# Patient Record
Sex: Female | Born: 1985 | Race: Black or African American | Hispanic: No | Marital: Single | Smoking: Former smoker
Health system: Southern US, Community
[De-identification: ages and names within clinical notes are randomized; demographics above are authoritative.]

## PROBLEM LIST (undated history)

## (undated) DIAGNOSIS — D219 Benign neoplasm of connective and other soft tissue, unspecified: Secondary | ICD-10-CM

## (undated) HISTORY — PX: KNEE SURGERY: SHX244

---

## 2008-05-29 ENCOUNTER — Inpatient Hospital Stay (HOSPITAL_COMMUNITY): Admission: AD | Admit: 2008-05-29 | Discharge: 2008-05-29 | Payer: Self-pay | Admitting: Obstetrics

## 2008-06-10 ENCOUNTER — Inpatient Hospital Stay (HOSPITAL_COMMUNITY): Admission: AD | Admit: 2008-06-10 | Discharge: 2008-06-10 | Payer: Self-pay | Admitting: Obstetrics

## 2008-06-30 ENCOUNTER — Inpatient Hospital Stay (HOSPITAL_COMMUNITY): Admission: RE | Admit: 2008-06-30 | Discharge: 2008-07-03 | Payer: Self-pay | Admitting: Obstetrics

## 2010-12-17 ENCOUNTER — Emergency Department (HOSPITAL_BASED_OUTPATIENT_CLINIC_OR_DEPARTMENT_OTHER)
Admission: EM | Admit: 2010-12-17 | Discharge: 2010-12-17 | Payer: Self-pay | Source: Home / Self Care | Admitting: Emergency Medicine

## 2011-03-02 ENCOUNTER — Emergency Department (HOSPITAL_BASED_OUTPATIENT_CLINIC_OR_DEPARTMENT_OTHER)
Admission: EM | Admit: 2011-03-02 | Discharge: 2011-03-02 | Disposition: A | Discharge status: Home / Self Care | Payer: Self-pay | Attending: Emergency Medicine | Admitting: Emergency Medicine

## 2011-03-02 DIAGNOSIS — H9209 Otalgia, unspecified ear: Secondary | ICD-10-CM | POA: Insufficient documentation

## 2011-03-02 DIAGNOSIS — H6690 Otitis media, unspecified, unspecified ear: Secondary | ICD-10-CM | POA: Insufficient documentation

## 2011-08-24 LAB — WET PREP, GENITAL
Clue Cells Wet Prep HPF POC: NONE SEEN
Yeast Wet Prep HPF POC: NONE SEEN

## 2011-08-25 LAB — URINALYSIS, ROUTINE W REFLEX MICROSCOPIC
Bilirubin Urine: NEGATIVE
Glucose, UA: NEGATIVE
Hgb urine dipstick: NEGATIVE
Protein, ur: NEGATIVE
Specific Gravity, Urine: 1.01
Urobilinogen, UA: 0.2

## 2011-08-25 LAB — RPR: RPR Ser Ql: NONREACTIVE

## 2011-08-25 LAB — CBC
HCT: 31.7 — ABNORMAL LOW
HCT: 25.8 — ABNORMAL LOW
Hemoglobin: 8.5 — ABNORMAL LOW
MCHC: 34
MCV: 87.4
MCV: 87.1
Platelets: 300
RBC: 3.63 — ABNORMAL LOW
RBC: 2.97 — ABNORMAL LOW
RBC: 3.8 — ABNORMAL LOW
RDW: 14.4
WBC: 10.7 — ABNORMAL HIGH
WBC: 10.4
WBC: 20.9 — ABNORMAL HIGH

## 2011-08-25 LAB — COMPREHENSIVE METABOLIC PANEL
AST: 22
BUN: 2 — ABNORMAL LOW
CO2: 24
Calcium: 8.7
Chloride: 109
Creatinine, Ser: 0.66
GFR calc Af Amer: >60
GFR calc non Af Amer: >60
Total Bilirubin: 0.7

## 2011-08-25 LAB — URIC ACID: Uric Acid, Serum: 3.8

## 2011-08-25 LAB — LACTATE DEHYDROGENASE: LDH: 150

## 2011-09-07 DIAGNOSIS — R5381 Other malaise: Secondary | ICD-10-CM | POA: Insufficient documentation

## 2011-09-08 ENCOUNTER — Emergency Department (HOSPITAL_BASED_OUTPATIENT_CLINIC_OR_DEPARTMENT_OTHER)
Admission: EM | Admit: 2011-09-08 | Discharge: 2011-09-08 | Disposition: A | Discharge status: Home / Self Care | Payer: Self-pay | Attending: Emergency Medicine | Admitting: Emergency Medicine

## 2011-09-08 ENCOUNTER — Encounter: Payer: Self-pay | Admitting: *Deleted

## 2011-09-08 DIAGNOSIS — R5383 Other fatigue: Secondary | ICD-10-CM

## 2011-09-08 HISTORY — DX: Benign neoplasm of connective and other soft tissue, unspecified: D21.9

## 2011-09-08 LAB — BASIC METABOLIC PANEL
BUN: 10 mg/dL (ref 6–23)
CO2: 26 mEq/L (ref 19–32)
Calcium: 9.3 mg/dL (ref 8.4–10.5)
Glucose, Bld: 88 mg/dL (ref 70–99)
Potassium: 4.6 mEq/L (ref 3.5–5.1)
Sodium: 138 mEq/L (ref 135–145)

## 2011-09-08 LAB — URINALYSIS, ROUTINE W REFLEX MICROSCOPIC
Bilirubin Urine: NEGATIVE
Glucose, UA: NEGATIVE mg/dL
Specific Gravity, Urine: 1.022 (ref 1.005–1.030)

## 2011-09-08 LAB — PREGNANCY, URINE: Preg Test, Ur: NEGATIVE

## 2011-09-08 LAB — CBC
HCT: 35.8 % — ABNORMAL LOW (ref 36.0–46.0)
Hemoglobin: 12.4 g/dL (ref 12.0–15.0)
MCH: 28.9 pg (ref 26.0–34.0)
MCHC: 34.6 g/dL (ref 30.0–36.0)
MCV: 83.4 fL (ref 78.0–100.0)
RBC: 4.29 MIL/uL (ref 3.87–5.11)

## 2011-09-08 LAB — DIFFERENTIAL
Basophils Relative: 0 % (ref 0–1)
Eosinophils Absolute: 0.2 10*3/uL (ref 0.0–0.7)
Eosinophils Relative: 3 % (ref 0–5)
Monocytes Absolute: 0.6 10*3/uL (ref 0.1–1.0)
Neutro Abs: 4.5 10*3/uL (ref 1.7–7.7)
Neutrophils Relative %: 55 % (ref 43–77)

## 2011-09-08 LAB — URINE MICROSCOPIC-ADD ON

## 2011-09-08 NOTE — ED Notes (Signed)
States felt weak after getting off work last night- also some dizziness and nausea without vomiting- pt reports abd bloating also but has dx of fibroids

## 2011-09-08 NOTE — ED Provider Notes (Signed)
History     CSN: [id number] Arrival date & time: 09/08/2011 12:09 AM  Chief Complaint  Patient presents with  . Fatigue    (Consider location/radiation/quality/duration/timing/severity/associated sxs/prior treatment) The history is provided by the patient. No language interpreter was used.  Jennifer Riddle is a 25 y.o.female who presents today complaining of fatigue and just not feeling right.  She denies any pain. She does have a history of fibroids but no other significant medical problems. She can think of nothing that makes her symptoms better or worse. She denies any nausea or vomiting. She's not had any urinary symptoms or vaginal discharge. Patient did not think she could be pregnant. She has no known sick contacts. She denies any recent weight changes or fevers. She can think of no other associated or modifying factors. Patient's symptoms have lasted for 3 days.  Past Medical History  Diagnosis Date  . Fibroid tumor     Past Surgical History  Procedure Date  . Knee surgery     History reviewed. No pertinent family history.  History  Substance Use Topics  . Smoking status: Former Games developer  . Smokeless tobacco: Never Used  . Alcohol Use: No    OB History    Grav Para Term Preterm Abortions TAB SAB Ect Mult Living                  Review of Systems  Constitutional: Negative.   HENT: Negative.   Eyes: Negative.   Respiratory: Negative.   Cardiovascular: Negative.   Gastrointestinal: Negative.   Genitourinary: Negative.   Musculoskeletal: Negative.   Skin: Negative.   Neurological: Negative.   Hematological: Negative.   Psychiatric/Behavioral: Negative.   All other systems reviewed and are negative.    Allergies  Sulfa antibiotics  Home Medications  No current outpatient prescriptions on file.  BP 106/67  Pulse 69  Temp(Src) 98 F (36.7 C) (Oral)  Resp 18  Ht 5\' 5"  (1.651 m)  Wt 110 lb (49.896 kg)  BMI 18.31 kg/m2  SpO2 100%  LMP  08/18/2011  Physical Exam  Nursing note and vitals reviewed. Constitutional: She is oriented to person, place, and time. She appears well-developed and well-nourished. No distress.  HENT:  Head: Normocephalic and atraumatic.  Eyes: Conjunctivae and EOM are normal. Pupils are equal, round, and reactive to light.  Neck: Normal range of motion.  Cardiovascular: Normal rate, regular rhythm, normal heart sounds and intact distal pulses.  Exam reveals no gallop and no friction rub.   No murmur heard. Pulmonary/Chest: Effort normal and breath sounds normal. No respiratory distress. She has no wheezes. She has no rales.  Abdominal: Soft. Bowel sounds are normal. She exhibits no distension. There is no tenderness. There is no rebound and no guarding.  Musculoskeletal: Normal range of motion. She exhibits no edema and no tenderness.  Neurological: She is alert and oriented to person, place, and time. No cranial nerve deficit. She exhibits normal muscle tone. Coordination normal.  Skin: Skin is warm and dry. No rash noted.  Psychiatric: She has a normal mood and affect.    ED Course  Procedures (including critical care time)  Labs Reviewed  CBC - Abnormal; Notable for the following:    HCT 35.8 (*)    All other components within normal limits  URINALYSIS, ROUTINE W REFLEX MICROSCOPIC - Abnormal; Notable for the following:    Hgb urine dipstick TRACE (*)    All other components within normal limits  DIFFERENTIAL  BASIC METABOLIC  PANEL  PREGNANCY, URINE  URINE MICROSCOPIC-ADD ON   No results found.   1. Fatigue       MDM  Patient is a well-developed well-nourished female in no acute distress. Patient complained of fatigue. She had a basic metabolic panel to evaluate for any electrolyte abnormalities as well as a urinalysis to evaluate for UTI and a urine pregnancy test to evaluate for possible pregnancy. She had no anemia on CBC. No additional studies were felt necessary. Patient was  told that she should followup with her primary care physician if this persisted. Patient was comfortable with plan and was discharged home in good condition.        Cyndra Numbers, MD 09/08/11 [phone]

## 2011-09-08 NOTE — ED Notes (Signed)
D/c home- No rx given

## 2012-09-27 ENCOUNTER — Emergency Department (HOSPITAL_BASED_OUTPATIENT_CLINIC_OR_DEPARTMENT_OTHER)
Admission: EM | Admit: 2012-09-27 | Discharge: 2012-09-27 | Disposition: A | Discharge status: Home / Self Care | Payer: Self-pay | Attending: Emergency Medicine | Admitting: Emergency Medicine

## 2012-09-27 ENCOUNTER — Encounter (HOSPITAL_BASED_OUTPATIENT_CLINIC_OR_DEPARTMENT_OTHER): Payer: Self-pay | Admitting: Emergency Medicine

## 2012-09-27 DIAGNOSIS — Z87891 Personal history of nicotine dependence: Secondary | ICD-10-CM | POA: Insufficient documentation

## 2012-09-27 DIAGNOSIS — Z3202 Encounter for pregnancy test, result negative: Secondary | ICD-10-CM | POA: Insufficient documentation

## 2012-09-27 DIAGNOSIS — N390 Urinary tract infection, site not specified: Secondary | ICD-10-CM | POA: Insufficient documentation

## 2012-09-27 DIAGNOSIS — Z872 Personal history of diseases of the skin and subcutaneous tissue: Secondary | ICD-10-CM | POA: Insufficient documentation

## 2012-09-27 DIAGNOSIS — R109 Unspecified abdominal pain: Secondary | ICD-10-CM | POA: Insufficient documentation

## 2012-09-27 LAB — URINALYSIS, ROUTINE W REFLEX MICROSCOPIC
Bilirubin Urine: NEGATIVE
Ketones, ur: NEGATIVE mg/dL
Protein, ur: 30 mg/dL — AB
Urobilinogen, UA: 1 mg/dL (ref 0.0–1.0)

## 2012-09-27 LAB — URINE MICROSCOPIC-ADD ON

## 2012-09-27 MED ORDER — CEPHALEXIN 500 MG PO CAPS: 500.0000 mg | ORAL_CAPSULE | Freq: Four times a day (QID) | ORAL | Status: DC

## 2012-09-27 NOTE — ED Provider Notes (Signed)
History     CSN: [id number]  Arrival date & time 09/27/12  1151   First MD Initiated Contact with Patient 09/27/12 1200      Chief Complaint  Patient presents with  . Dysuria    (Consider location/radiation/quality/duration/timing/severity/associated sxs/prior treatment) Patient is a 26 y.o. female presenting with dysuria. The history is provided by the patient.  Dysuria  Associated symptoms include urgency. Pertinent negatives include no nausea and no vomiting.   patient with a history of urinary frequency for 3 days. Some mild discomfort in the suprapubic area. Urgency with urination. No fever no significant abdominal pain or back pain no nausea no vomiting. Last menstrual period was October 25.  Past Medical History  Diagnosis Date  . Fibroid tumor     Past Surgical History  Procedure Date  . Knee surgery     No family history on file.  History  Substance Use Topics  . Smoking status: Former Games developer  . Smokeless tobacco: Never Used  . Alcohol Use: No    OB History    Grav Para Term Preterm Abortions TAB SAB Ect Mult Living                  Review of Systems  Constitutional: Negative for fever.  HENT: Negative for congestion.   Respiratory: Negative for shortness of breath.   Cardiovascular: Negative for chest pain.  Gastrointestinal: Positive for abdominal pain. Negative for nausea and vomiting.  Genitourinary: Positive for dysuria and urgency. Negative for vaginal discharge.  Musculoskeletal: Negative for back pain.  Skin: Negative for rash.  Neurological: Negative for headaches.  Hematological: Does not bruise/bleed easily.    Allergies  Sulfa antibiotics  Home Medications   Current Outpatient Rx  Name Route Sig Dispense Refill  . CEPHALEXIN 500 MG PO CAPS Oral Take 1 capsule (500 mg total) by mouth 4 (four) times daily. 28 capsule 0    BP 123/76  Pulse 79  Temp 98.1 F (36.7 C) (Oral)  Resp 16  Ht 5\' 6"  (1.676 m)  Wt 108 lb (48.988 kg)   BMI 17.43 kg/m2  SpO2 100%  LMP 09/20/2012  Physical Exam  Nursing note and vitals reviewed. Constitutional: She is oriented to person, place, and time. She appears well-developed and well-nourished.  HENT:  Head: Normocephalic and atraumatic.  Eyes: Conjunctivae normal are normal. Pupils are equal, round, and reactive to light.  Neck: Normal range of motion.  Cardiovascular: Normal rate and regular rhythm.   Pulmonary/Chest: Effort normal and breath sounds normal.  Abdominal: Soft. Bowel sounds are normal. There is no tenderness.  Musculoskeletal: Normal range of motion.  Neurological: She is alert and oriented to person, place, and time. No cranial nerve deficit. She exhibits normal muscle tone. Coordination normal.  Skin: Skin is warm. No rash noted.    ED Course  Procedures (including critical care time)  Labs Reviewed  URINALYSIS, ROUTINE W REFLEX MICROSCOPIC - Abnormal; Notable for the following:    APPearance CLOUDY (*)     Hgb urine dipstick SMALL (*)     Protein, ur 30 (*)     Leukocytes, UA LARGE (*)     All other components within normal limits  URINE MICROSCOPIC-ADD ON - Abnormal; Notable for the following:    Squamous Epithelial / LPF FEW (*)     Bacteria, UA FEW (*)     All other components within normal limits  PREGNANCY, URINE  URINE CULTURE   No results found.   1. Urinary  tract infection       MDM  Urinalysis consistent with urinary tract infection. Patient is not having signs or symptoms consistent with pyelonephritis. She is not pregnant. Will treat with Keflex urine was sent for culture.        Shelda Jakes, MD 09/27/12 1324

## 2012-09-27 NOTE — ED Notes (Signed)
Pt c/o urinary frequency x 3 days

## 2012-09-29 LAB — URINE CULTURE

## 2012-11-01 ENCOUNTER — Encounter (HOSPITAL_BASED_OUTPATIENT_CLINIC_OR_DEPARTMENT_OTHER): Payer: Self-pay | Admitting: *Deleted

## 2012-11-01 ENCOUNTER — Emergency Department (HOSPITAL_BASED_OUTPATIENT_CLINIC_OR_DEPARTMENT_OTHER)
Admission: EM | Admit: 2012-11-01 | Discharge: 2012-11-01 | Disposition: A | Discharge status: Home / Self Care | Payer: Self-pay | Attending: Emergency Medicine | Admitting: Emergency Medicine

## 2012-11-01 DIAGNOSIS — R6883 Chills (without fever): Secondary | ICD-10-CM | POA: Insufficient documentation

## 2012-11-01 DIAGNOSIS — Z87891 Personal history of nicotine dependence: Secondary | ICD-10-CM | POA: Insufficient documentation

## 2012-11-01 DIAGNOSIS — Z8742 Personal history of other diseases of the female genital tract: Secondary | ICD-10-CM | POA: Insufficient documentation

## 2012-11-01 DIAGNOSIS — R197 Diarrhea, unspecified: Secondary | ICD-10-CM | POA: Insufficient documentation

## 2012-11-01 DIAGNOSIS — R109 Unspecified abdominal pain: Secondary | ICD-10-CM | POA: Insufficient documentation

## 2012-11-01 DIAGNOSIS — Z792 Long term (current) use of antibiotics: Secondary | ICD-10-CM | POA: Insufficient documentation

## 2012-11-01 DIAGNOSIS — R112 Nausea with vomiting, unspecified: Secondary | ICD-10-CM | POA: Insufficient documentation

## 2012-11-01 LAB — URINE MICROSCOPIC-ADD ON

## 2012-11-01 LAB — URINALYSIS, ROUTINE W REFLEX MICROSCOPIC
Bilirubin Urine: NEGATIVE
Glucose, UA: NEGATIVE mg/dL
Hgb urine dipstick: NEGATIVE
Nitrite: NEGATIVE
Specific Gravity, Urine: 1.02 (ref 1.005–1.030)
pH: 6.5 (ref 5.0–8.0)

## 2012-11-01 MED ORDER — ONDANSETRON HCL 4 MG/2ML IJ SOLN
4.0000 mg | Freq: Once | INTRAMUSCULAR | Status: AC
  Administered 2012-11-01: 4 mg via INTRAVENOUS
  Filled 2012-11-01: qty 2

## 2012-11-01 MED ORDER — PROMETHAZINE HCL 25 MG PO TABS: 25.0000 mg | ORAL_TABLET | Freq: Four times a day (QID) | ORAL | Status: DC | PRN

## 2012-11-01 MED ORDER — SODIUM CHLORIDE 0.9 % IV BOLUS (SEPSIS)
1000.0000 mL | Freq: Once | INTRAVENOUS | Status: AC
  Administered 2012-11-01: 1000 mL via INTRAVENOUS

## 2012-11-01 NOTE — ED Notes (Signed)
Patient states she has had nausea, vomiting and diarrhea since 8pm last night.  And is associated with chills.

## 2012-11-01 NOTE — ED Provider Notes (Signed)
History     CSN: [id number]  Arrival date & time 11/01/12  7829   First MD Initiated Contact with Patient 11/01/12 [phone]      Chief Complaint  Patient presents with  . Diarrhea  . Emesis    The history is provided by the patient.   patient reports nausea vomiting and diarrhea since last night.  Chills without documented fever.  Abdominal cramping without focal pain.  No chest pain or shortness of breath.  Symptoms are mild in severity.  Nothing worsens or improves her symptoms.  Recent sick contacts as her daughter had similar symptoms.  No other significant medical problems.  No blood in her stool or vomit   Past Medical History  Diagnosis Date  . Fibroid tumor     Past Surgical History  Procedure Date  . Knee surgery     No family history on file.  History  Substance Use Topics  . Smoking status: Former Games developer  . Smokeless tobacco: Never Used  . Alcohol Use: No    OB History    Grav Para Term Preterm Abortions TAB SAB Ect Mult Living                  Review of Systems  All other systems reviewed and are negative.    Allergies  Sulfa antibiotics  Home Medications   Current Outpatient Rx  Name  Route  Sig  Dispense  Refill  . ACETAMINOPHEN 500 MG PO TABS   Oral   Take 1,000 mg by mouth every 6 (six) hours as needed.         . CEPHALEXIN 500 MG PO CAPS   Oral   Take 1 capsule (500 mg total) by mouth 4 (four) times daily.   28 capsule   0   . PROMETHAZINE HCL 25 MG PO TABS   Oral   Take 1 tablet (25 mg total) by mouth every 6 (six) hours as needed for nausea.   12 tablet   0     BP 128/72  Pulse 88  Temp 98.4 F (36.9 C) (Oral)  Resp 20  Ht 5\' 5"  (1.651 m)  Wt 105 lb (47.628 kg)  BMI 17.47 kg/m2  SpO2 100%  LMP 10/07/2012  Physical Exam  Nursing note and vitals reviewed. Constitutional: She is oriented to person, place, and time. She appears well-developed and well-nourished. No distress.  HENT:  Head: Normocephalic and atraumatic.   Eyes: EOM are normal.  Neck: Normal range of motion.  Cardiovascular: Normal rate, regular rhythm and normal heart sounds.   Pulmonary/Chest: Effort normal and breath sounds normal.  Abdominal: Soft. She exhibits no distension. There is no tenderness.  Musculoskeletal: Normal range of motion.  Neurological: She is alert and oriented to person, place, and time.  Skin: Skin is warm and dry.  Psychiatric: She has a normal mood and affect. Judgment normal.    ED Course  Procedures (including critical care time)  Labs Reviewed  URINALYSIS, ROUTINE W REFLEX MICROSCOPIC - Abnormal; Notable for the following:    Leukocytes, UA SMALL (*)     All other components within normal limits  URINE MICROSCOPIC-ADD ON - Abnormal; Notable for the following:    Squamous Epithelial / LPF FEW (*)     Bacteria, UA FEW (*)     All other components within normal limits  PREGNANCY, URINE   No results found.   1. Nausea vomiting and diarrhea       MDM  Likely viral gastroenteritis. abd benign on exam. Doubt appendicitis. Able to tolerate fluids in the ER. zofran given. Non toxic appearance.  Hydrated in the emergency department.  10:52 AM Feels much better.  Stable for discharge      Lyanne Co, MD 11/01/12 1053

## 2012-11-27 ENCOUNTER — Encounter (HOSPITAL_BASED_OUTPATIENT_CLINIC_OR_DEPARTMENT_OTHER): Payer: Self-pay | Admitting: Emergency Medicine

## 2012-11-27 ENCOUNTER — Emergency Department (HOSPITAL_BASED_OUTPATIENT_CLINIC_OR_DEPARTMENT_OTHER)
Admission: EM | Admit: 2012-11-27 | Discharge: 2012-11-27 | Disposition: A | Discharge status: Home / Self Care | Payer: Self-pay | Attending: Emergency Medicine | Admitting: Emergency Medicine

## 2012-11-27 DIAGNOSIS — R51 Headache: Secondary | ICD-10-CM | POA: Insufficient documentation

## 2012-11-27 DIAGNOSIS — R52 Pain, unspecified: Secondary | ICD-10-CM | POA: Insufficient documentation

## 2012-11-27 DIAGNOSIS — Z87891 Personal history of nicotine dependence: Secondary | ICD-10-CM | POA: Insufficient documentation

## 2012-11-27 DIAGNOSIS — R000 Tachycardia, unspecified: Secondary | ICD-10-CM | POA: Insufficient documentation

## 2012-11-27 DIAGNOSIS — J111 Influenza due to unidentified influenza virus with other respiratory manifestations: Secondary | ICD-10-CM

## 2012-11-27 DIAGNOSIS — R059 Cough, unspecified: Secondary | ICD-10-CM | POA: Insufficient documentation

## 2012-11-27 DIAGNOSIS — R509 Fever, unspecified: Secondary | ICD-10-CM | POA: Insufficient documentation

## 2012-11-27 DIAGNOSIS — R05 Cough: Secondary | ICD-10-CM | POA: Insufficient documentation

## 2012-11-27 DIAGNOSIS — Z3202 Encounter for pregnancy test, result negative: Secondary | ICD-10-CM | POA: Insufficient documentation

## 2012-11-27 LAB — PREGNANCY, URINE: Preg Test, Ur: NEGATIVE

## 2012-11-27 MED ORDER — OSELTAMIVIR PHOSPHATE 75 MG PO CAPS: 75.0000 mg | ORAL_CAPSULE | Freq: Two times a day (BID) | ORAL | Status: DC

## 2012-11-27 MED ORDER — IBUPROFEN 400 MG PO TABS
600.0000 mg | ORAL_TABLET | Freq: Once | ORAL | Status: AC
  Administered 2012-11-27: 600 mg via ORAL
  Filled 2012-11-27: qty 1

## 2012-11-27 MED ORDER — GUAIFENESIN-CODEINE 100-10 MG/5ML PO SYRP: 5.0000 mL | ORAL_SOLUTION | Freq: Three times a day (TID) | ORAL | Status: DC | PRN

## 2012-11-27 NOTE — ED Provider Notes (Signed)
History     CSN: [id number]  Arrival date & time 11/27/12  1752   First MD Initiated Contact with Patient 11/27/12 2035      Chief Complaint  Patient presents with  . Generalized Body Aches  . Cough  . Fever    (Consider location/radiation/quality/duration/timing/severity/associated sxs/prior treatment) HPI Comments: Patient reports she began coughing less than 48 hours ago and last night developed achiness, fever chills and joint aches. Patient took Tylenol last night and fever broke overnight woke up sweaty. She remains achy all over, tried to go to work today but fevers Coming back. She denies any nausea or vomiting. She does have some mild nasal congestion. 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 She did not have her influenza vaccination.  Patient is a 27 y.o. female presenting with cough and fever. The history is provided by the patient.  Cough Associated symptoms include chills, headaches and myalgias.  Fever Primary symptoms of the febrile illness include fever, fatigue, headaches, cough, myalgias and arthralgias. Primary symptoms do not include nausea, vomiting, diarrhea or rash.  The headache is not associated with neck stiffness.    Past Medical History  Diagnosis Date  . Fibroid tumor     Past Surgical History  Procedure Date  . Knee surgery     History reviewed. No pertinent family history.  History  Substance Use Topics  . Smoking status: Former Games developer  . Smokeless tobacco: Never Used  . Alcohol Use: No    OB History    Grav Para Term Preterm Abortions TAB SAB Ect Mult Living                  Review of Systems  Constitutional: Positive for fever, chills and fatigue. Negative for appetite change.  HENT: Positive for neck pain. Negative for neck stiffness.   Respiratory: Positive for cough.   Gastrointestinal: Negative for nausea, vomiting and diarrhea.  Musculoskeletal: Positive for  myalgias and arthralgias.  Skin: Negative for rash.  Neurological: Positive for headaches.    Allergies  Sulfa antibiotics  Home Medications   Current Outpatient Rx  Name  Route  Sig  Dispense  Refill  . ACETAMINOPHEN 500 MG PO TABS   Oral   Take 1,000 mg by mouth every 6 (six) hours as needed.         . GUAIFENESIN-CODEINE 100-10 MG/5ML PO SYRP   Oral   Take 5 mLs by mouth 3 (three) times daily as needed for cough.   120 mL   0   . OSELTAMIVIR PHOSPHATE 75 MG PO CAPS   Oral   Take 1 capsule (75 mg total) by mouth every 12 (twelve) hours.   10 capsule   0     BP 128/94  Pulse 110  Temp 99.9 F (37.7 C) (Oral)  Resp 18  Ht 5\' 5"  (1.651 m)  Wt 108 lb (48.988 kg)  BMI 17.97 kg/m2  SpO2 100%  LMP 11/27/2012  Physical Exam  Nursing note and vitals reviewed. Constitutional: She is oriented to person, place, and time. She appears well-developed and well-nourished.  HENT:  Head: Normocephalic and atraumatic.  Eyes: Pupils are equal, round, and reactive to light.  Neck: Normal range of motion. No tracheal deviation present.  Cardiovascular: Regular rhythm.  Tachycardia present.   Pulmonary/Chest: Effort normal. No stridor. No respiratory distress. She has no wheezes. She has no rales.  Abdominal: Soft. She exhibits no distension.  There is no tenderness.  Lymphadenopathy:    She has no cervical adenopathy.  Neurological: She is alert and oriented to person, place, and time. No cranial nerve deficit.  Skin: Skin is dry. No rash noted.    ED Course  Procedures (including critical care time)   Labs Reviewed  PREGNANCY, URINE   No results found.   1. Influenza-like illness     Room air saturation is 100% and I interpret this to be normal.  MDM   Patient's symptoms are consistent with influenza-like illness. Plan is to give cough medication, decongestant with codeine as well as Tamiflu. Patient is not toxic or septic in appearance. She agreed to  plan.        Gavin Pound. Oletta Lamas, MD 11/27/12 2048

## 2012-11-27 NOTE — Discharge Instructions (Signed)
Influenza, Adult Influenza ("the flu") is a viral infection of the respiratory tract. It occurs more often in winter months because people spend more time in close contact with one another. Influenza can make you feel very sick. Influenza easily spreads from person to person (contagious). CAUSES   Influenza is caused by a virus that infects the respiratory tract. You can catch the virus by breathing in droplets from an infected person's cough or sneeze. You can also catch the virus by touching something that was recently contaminated with the virus and then touching your mouth, nose, or eyes. SYMPTOMS   Symptoms typically last 4 to 10 days and may include:  Fever.   Chills.   Headache, body aches, and muscle aches.   Sore throat.   Chest discomfort and cough.   Poor appetite.   Weakness or feeling tired.   Dizziness.   Nausea or vomiting.  DIAGNOSIS   Diagnosis of influenza is often made based on your history and a physical exam. A nose or throat swab test can be done to confirm the diagnosis. RISKS AND COMPLICATIONS You may be at risk for a more severe case of influenza if you smoke cigarettes, have diabetes, have chronic heart disease (such as heart failure) or lung disease (such as asthma), or if you have a weakened immune system. Elderly people and pregnant women are also at risk for more serious infections. The most common complication of influenza is a lung infection (pneumonia). Sometimes, this complication can require emergency medical care and may be life-threatening. PREVENTION   An annual influenza vaccination (flu shot) is the best way to avoid getting influenza. An annual flu shot is now routinely recommended for all adults in the [place] TREATMENT   In mild cases, influenza goes away on its own. Treatment is directed at relieving symptoms. For more severe cases, your caregiver may prescribe antiviral medicines to shorten the sickness. Antibiotic medicines are not effective,  because the infection is caused by a virus, not by bacteria. HOME CARE INSTRUCTIONS  Only take over-the-counter or prescription medicines for pain, discomfort, or fever as directed by your caregiver.   Use a cool mist humidifier to make breathing easier.   Get plenty of rest until your temperature returns to normal. This usually takes 3 to 4 days.   Drink enough fluids to keep your urine clear or pale yellow.   Cover your mouth and nose when coughing or sneezing, and wash your hands well to avoid spreading the virus.   Stay home from work or school until your fever has been gone for at least 1 full day.  SEEK MEDICAL CARE IF:    You have chest pain or a deep cough that worsens or produces more mucus.   You have nausea, vomiting, or diarrhea.  SEEK IMMEDIATE MEDICAL CARE IF:    You have difficulty breathing, shortness of breath, or your skin or nails turn bluish.   You have severe neck pain or stiffness.   You have a severe headache, facial pain, or earache.   You have a worsening or recurring fever.   You have nausea or vomiting that cannot be controlled.  MAKE SURE YOU:  Understand these instructions.   Will watch your condition.   Will get help right away if you are not doing well or get worse.  Document Released: 11/10/2000 Document Revised: 05/14/2012 Document Reviewed: 02/12/2012 [employer] Patient Information 2013 [employer].    

## 2012-11-27 NOTE — ED Notes (Signed)
Pt having fever, cough x 2 days.  Pt has generalized aches and pains.  No N/V/D.

## 2013-02-15 ENCOUNTER — Emergency Department (HOSPITAL_BASED_OUTPATIENT_CLINIC_OR_DEPARTMENT_OTHER)
Admission: EM | Admit: 2013-02-15 | Discharge: 2013-02-15 | Disposition: A | Discharge status: Home / Self Care | Payer: Self-pay | Attending: Emergency Medicine | Admitting: Emergency Medicine

## 2013-02-15 ENCOUNTER — Encounter (HOSPITAL_BASED_OUTPATIENT_CLINIC_OR_DEPARTMENT_OTHER): Payer: Self-pay | Admitting: *Deleted

## 2013-02-15 DIAGNOSIS — K089 Disorder of teeth and supporting structures, unspecified: Secondary | ICD-10-CM | POA: Insufficient documentation

## 2013-02-15 DIAGNOSIS — K029 Dental caries, unspecified: Secondary | ICD-10-CM | POA: Insufficient documentation

## 2013-02-15 DIAGNOSIS — Z8742 Personal history of other diseases of the female genital tract: Secondary | ICD-10-CM | POA: Insufficient documentation

## 2013-02-15 DIAGNOSIS — K0889 Other specified disorders of teeth and supporting structures: Secondary | ICD-10-CM

## 2013-02-15 DIAGNOSIS — Z87891 Personal history of nicotine dependence: Secondary | ICD-10-CM | POA: Insufficient documentation

## 2013-02-15 MED ORDER — PENICILLIN V POTASSIUM 500 MG PO TABS: 500.0000 mg | ORAL_TABLET | Freq: Four times a day (QID) | ORAL | Status: AC

## 2013-02-15 MED ORDER — HYDROCODONE-ACETAMINOPHEN 5-325 MG PO TABS: 1.0000 | ORAL_TABLET | ORAL | Status: DC | PRN

## 2013-02-15 NOTE — ED Provider Notes (Signed)
History     CSN: [id number]  Arrival date & time 02/15/13  1527   First MD Initiated Contact with Patient 02/15/13 1655      Chief Complaint  Patient presents with  . Dental Pain    (Consider location/radiation/quality/duration/timing/severity/associated sxs/prior treatment) HPI Comments: Pt presents to the ED for right sided dental pain since last night.  Reports that her wisdom teeth are coming in but now her gums are starting to swell and it is very painful to chew food.  Pain is radiating into her left ear.  Is established with a dentist but states she just needs some relief until Monday.  Has tried taking OTC tylenol and ibuprofen without relief.  Denies any jaw popping or trouble opening her mouth fully.  Patient is a 27 y.o. female presenting with tooth pain. The history is provided by the patient.  Dental Pain   Past Medical History  Diagnosis Date  . Fibroid tumor     Past Surgical History  Procedure Laterality Date  . Knee surgery      History reviewed. No pertinent family history.  History  Substance Use Topics  . Smoking status: Former Games developer  . Smokeless tobacco: Never Used  . Alcohol Use: No    OB History   Grav Para Term Preterm Abortions TAB SAB Ect Mult Living                  Review of Systems  HENT: Positive for dental problem.   All other systems reviewed and are negative.    Allergies  Sulfa antibiotics  Home Medications   Current Outpatient Rx  Name  Route  Sig  Dispense  Refill  . acetaminophen (TYLENOL) 500 MG tablet   Oral   Take 1,000 mg by mouth every 6 (six) hours as needed.         Marland Kitchen guaiFENesin-codeine (ROBITUSSIN AC) 100-10 MG/5ML syrup   Oral   Take 5 mLs by mouth 3 (three) times daily as needed for cough.   120 mL   0   . oseltamivir (TAMIFLU) 75 MG capsule   Oral   Take 1 capsule (75 mg total) by mouth every 12 (twelve) hours.   10 capsule   0     BP 128/83  Pulse 86  Temp(Src) 98.1 F (36.7 C) (Oral)   Resp 18  Ht 5\' 5"  (1.651 m)  Wt 107 lb (48.535 kg)  BMI 17.81 kg/m2  SpO2 100%  LMP 02/08/2013  Physical Exam  Nursing note and vitals reviewed. Constitutional: She is oriented to person, place, and time. She appears well-developed and well-nourished.  HENT:  Head: Normocephalic and atraumatic. No trismus in the jaw.  Right Ear: Tympanic membrane and ear canal normal.  Left Ear: Tympanic membrane and ear canal normal.  Nose: Nose normal.  Mouth/Throat: Uvula is midline, oropharynx is clear and moist and mucous membranes are normal. Abnormal dentition. Dental caries present. No dental abscesses. No oropharyngeal exudate, posterior oropharyngeal edema, posterior oropharyngeal erythema or tonsillar abscesses.  Gums swollen around R lower molar, mild erythema, no apparent abscess, no trismus  Eyes: Conjunctivae and EOM are normal. Pupils are equal, round, and reactive to light.  Neck: Normal range of motion. Neck supple. No rigidity.  Cardiovascular: Normal rate, regular rhythm and normal heart sounds.   Pulmonary/Chest: Effort normal and breath sounds normal.  Lymphadenopathy:    She has no cervical adenopathy.  Neurological: She is alert and oriented to person, place, and time.  Skin: Skin is warm and dry.  Psychiatric: She has a normal mood and affect.    ED Course  Procedures (including critical care time)  Labs Reviewed - No data to display No results found.   1. Pain, dental       MDM   27 y.o. Female presenting to the ED for dental pain.  Reports that her wisdom teeth are coming in but now her gums are starting to swell and it is very painful to chew food.  Pain is radiating into her left ear. Moderate amount of swelling around R lower molar, mild erythema, no apparent abscess at this time but concern for one forming.  Will tx ppx with penicillin x 10d.  Rx vicodin PRN pain.  Pt is established with dentist- instructed to follow up asap.  Return precautions  advised.        Garlon Hatchet, PA-C 02/16/13 [phone]

## 2013-02-15 NOTE — ED Notes (Signed)
Pt c/o dental pain (bottom right wisdom tooth) since yesterday. "Think it's coming in".

## 2013-02-16 NOTE — ED Provider Notes (Signed)
Medical screening examination/treatment/procedure(s) were performed by non-physician practitioner and as supervising physician I was immediately available for consultation/collaboration.  Geoffery Lyons, MD 02/16/13 [phone]

## 2013-09-08 ENCOUNTER — Emergency Department (HOSPITAL_BASED_OUTPATIENT_CLINIC_OR_DEPARTMENT_OTHER): Payer: Self-pay

## 2013-09-08 ENCOUNTER — Encounter (HOSPITAL_BASED_OUTPATIENT_CLINIC_OR_DEPARTMENT_OTHER): Payer: Self-pay | Admitting: Emergency Medicine

## 2013-09-08 ENCOUNTER — Emergency Department (HOSPITAL_BASED_OUTPATIENT_CLINIC_OR_DEPARTMENT_OTHER)
Admission: EM | Admit: 2013-09-08 | Discharge: 2013-09-08 | Disposition: A | Discharge status: Home / Self Care | Payer: Self-pay | Attending: Emergency Medicine | Admitting: Emergency Medicine

## 2013-09-08 DIAGNOSIS — W208XXA Other cause of strike by thrown, projected or falling object, initial encounter: Secondary | ICD-10-CM | POA: Insufficient documentation

## 2013-09-08 DIAGNOSIS — Z8742 Personal history of other diseases of the female genital tract: Secondary | ICD-10-CM | POA: Insufficient documentation

## 2013-09-08 DIAGNOSIS — S59909A Unspecified injury of unspecified elbow, initial encounter: Secondary | ICD-10-CM | POA: Insufficient documentation

## 2013-09-08 DIAGNOSIS — M25531 Pain in right wrist: Secondary | ICD-10-CM

## 2013-09-08 DIAGNOSIS — S6990XA Unspecified injury of unspecified wrist, hand and finger(s), initial encounter: Secondary | ICD-10-CM | POA: Insufficient documentation

## 2013-09-08 DIAGNOSIS — Y939 Activity, unspecified: Secondary | ICD-10-CM | POA: Insufficient documentation

## 2013-09-08 DIAGNOSIS — Y9289 Other specified places as the place of occurrence of the external cause: Secondary | ICD-10-CM | POA: Insufficient documentation

## 2013-09-08 DIAGNOSIS — Y990 Civilian activity done for income or pay: Secondary | ICD-10-CM | POA: Insufficient documentation

## 2013-09-08 NOTE — ED Notes (Signed)
Pain in her right wrist. She works for the [employer] and dropped a box on her wrist a month ago. Pain has not gotten better with time.

## 2013-09-08 NOTE — ED Provider Notes (Signed)
CSN: [id number]     Arrival date & time 09/08/13  1551 History  This chart was scribed for Antionette Char, PA, working with Shanna Cisco, MD by Blanchard Kelch, ED Scribe. This patient was seen in room MH01/MH01 and the patient's care was started at 4:05 PM.    Chief Complaint  Patient presents with  . Wrist Pain    The history is provided by the patient. No language interpreter was used.    HPI Comments: Jennifer Riddle is a 27 y.o. female who presents to the Emergency Department complaining of constant, worsening right wrist pain that began three weeks ago when she injured it at work. She states that a 50 pound package fell and hit her wrist while it was flexed, which is when the pain began. Her pain is located in the top of her right wrist diffusely.  The pain radiates up her right arm and down her fingers intermittently. She describes the pain currently as dull and aching sensation but it started out as sharp. She states that there was swelling and bruising to the right wrist initially however this has resolved. The pain is worsened with twisting and flexion movements. She has been taking ibuprofen and using ice for the pain with temporary relief. She intermittently has numbness and tingling but denies this currently.  She denies any weakness or loss of sensation.  She otherwise has been well with no fevers, chills, change in appetite/activity, nausea, vomiting, headache, diarrhea, constipation, cough, or rhinorrhea. Patient is right-handed. She denies any injuries or previous surgeries on her right hand or wrist in the past.     Past Medical History  Diagnosis Date  . Fibroid tumor    Past Surgical History  Procedure Laterality Date  . Knee surgery     No family history on file. History  Substance Use Topics  . Smoking status: Former Games developer  . Smokeless tobacco: Never Used  . Alcohol Use: No   OB History   Grav Para Term Preterm Abortions TAB SAB Ect Mult Living                  Review of Systems  Constitutional: Negative for fever, chills, activity change, appetite change and fatigue.  HENT: Negative for ear pain, rhinorrhea and sore throat.   Eyes: Negative for visual disturbance.  Respiratory: Negative for cough and shortness of breath.   Cardiovascular: Negative for chest pain.  Gastrointestinal: Negative for nausea, vomiting, abdominal pain and diarrhea.  Genitourinary: Negative for dysuria.  Musculoskeletal: Positive for arthralgias (right wrist). Negative for back pain, gait problem, joint swelling, myalgias, neck pain and neck stiffness.  Skin: Negative for color change and wound.  Neurological: Negative for weakness, numbness and headaches.  Psychiatric/Behavioral: Negative for confusion.  All other systems reviewed and are negative.    Allergies  Sulfa antibiotics  Home Medications   Current Outpatient Rx  Name  Route  Sig  Dispense  Refill  . acetaminophen (TYLENOL) 500 MG tablet   Oral   Take 1,000 mg by mouth every 6 (six) hours as needed.         Marland Kitchen guaiFENesin-codeine (ROBITUSSIN AC) 100-10 MG/5ML syrup   Oral   Take 5 mLs by mouth 3 (three) times daily as needed for cough.   120 mL   0   . HYDROcodone-acetaminophen (NORCO/VICODIN) 5-325 MG per tablet   Oral   Take 1 tablet by mouth every 4 (four) hours as needed for pain.   20  tablet   0   . oseltamivir (TAMIFLU) 75 MG capsule   Oral   Take 1 capsule (75 mg total) by mouth every 12 (twelve) hours.   10 capsule   0    Triage Vitals: BP 135/75  Pulse 76  Temp(Src) 98.5 F (36.9 C) (Oral)  Resp 20  Ht 5\' 5"  (1.651 m)  Wt 107 lb (48.535 kg)  BMI 17.81 kg/m2  SpO2 100%  Filed Vitals:   09/08/13 1557  BP: 135/75  Pulse: 76  Temp: 98.5 F (36.9 C)  TempSrc: Oral  Resp: 20  Height: 5\' 5"  (1.651 m)  Weight: 107 lb (48.535 kg)  SpO2: 100%    Physical Exam  Nursing note and vitals reviewed. Constitutional: She is oriented to person, place, and time. She  appears well-developed and well-nourished. No distress.  HENT:  Head: Normocephalic and atraumatic.  Nose: Nose normal.  Mouth/Throat: Uvula is midline and oropharynx is clear and moist. No oropharyngeal exudate.  Eyes: Conjunctivae and EOM are normal. Right eye exhibits no discharge. Left eye exhibits no discharge.  Neck: Normal range of motion. Neck supple. No tracheal deviation present.  Cardiovascular: Normal rate, regular rhythm, normal heart sounds and intact distal pulses.  Exam reveals no gallop and no friction rub.   No murmur heard. Pulses:      Radial pulses are 2+ on the right side, and 2+ on the left side.  Pulmonary/Chest: Effort normal and breath sounds normal. No respiratory distress. She has no wheezes.  Abdominal: Soft. She exhibits no distension. There is no tenderness.  Musculoskeletal: Normal range of motion. She exhibits tenderness. She exhibits no edema.  Tenderness to palpation to the dorsum of the right wrist throughout with no focal tenderness. No snuff box tenderness on the right. Patient able to actively flex, extend, and circumduct wrist without any difficulty or limitations. Grip strength 5 out of 5 bilaterally. No tenderness to the digits of the right hand or right elbow. Patient able to flex and extend right elbow without any difficulty or limitations.  Neurological: She is alert and oriented to person, place, and time.  Sensation intact in the right hand throughout  Skin: Skin is warm and dry.  No erythema, ecchymosis, edema, or wounds to the right wrist throughout.  Psychiatric: She has a normal mood and affect. Her behavior is normal.    ED Course  Procedures (including critical care time)  DIAGNOSTIC STUDIES: Oxygen Saturation is 100% on room air, normal by my interpretation.    COORDINATION OF CARE: 4:00 PM - Patient verbalizes understanding and agrees with treatment plan.  Labs Review Labs Reviewed - No data to display Imaging Review No results  found.  EKG Interpretation   None      DG Wrist Complete Right (Final result)  Result time: 09/08/13 16:24:41    Final result by Rad Results In Interface (09/08/13 16:24:41)    Narrative:   CLINICAL DATA: Right wrist pain following an injury 3 weeks ago.  EXAM: RIGHT WRIST - COMPLETE 3+ VIEW  COMPARISON: None.  FINDINGS: There is no evidence of fracture or dislocation. There is no evidence of arthropathy or other focal bone abnormality. Soft tissues are unremarkable.  IMPRESSION: Normal examination.   Electronically Signed By: Gordan Payment M.D. On: 09/08/2013 16:24    MDM   1. Wrist pain, right     Jennifer Riddle is a 27 y.o. female who presents to the Emergency Department complaining of constant, worsening right wrist pain that  began three weeks ago when she injured it at work.  X-ray of the right wrist ordered. She declined pain medications at this time. Wrist splint ordered.      Etiology of wrist pain is possibly due to a contusion vs sprain/strain.  X-rays were negative for fx or dislocation.  Patient was neurovascularly intact.  Pain was well controlled without intervention.  Patient instructed to follow-up with PCP or orthopedic specialist if symptoms are not improving or worsening.  Patient declined OP pain management.  Patient was instructed to return to the ED if they experience any weakness, loss of sensation, fever, or other concerns.  Patient was in agreement with discharge and plan.     Final impressions: 1. Wrist pain right     Luiz Iron PA-C   I personally performed the services described in this documentation, which was scribed in my presence. The recorded information has been reviewed and is accurate.    Jillyn Ledger, PA-C 09/10/13 [phone]

## 2013-09-10 NOTE — ED Provider Notes (Signed)
Medical screening examination/treatment/procedure(s) were performed by non-physician practitioner and as supervising physician I was immediately available for consultation/collaboration.  Aceyn Kathol E Martavious Hartel, MD 09/10/13 1235 

## 2014-03-21 ENCOUNTER — Encounter (HOSPITAL_BASED_OUTPATIENT_CLINIC_OR_DEPARTMENT_OTHER): Payer: Self-pay | Admitting: Emergency Medicine

## 2014-03-21 ENCOUNTER — Emergency Department (HOSPITAL_BASED_OUTPATIENT_CLINIC_OR_DEPARTMENT_OTHER)
Admission: EM | Admit: 2014-03-21 | Discharge: 2014-03-21 | Disposition: A | Discharge status: Home / Self Care | Payer: Self-pay | Attending: Emergency Medicine | Admitting: Emergency Medicine

## 2014-03-21 DIAGNOSIS — J029 Acute pharyngitis, unspecified: Secondary | ICD-10-CM | POA: Insufficient documentation

## 2014-03-21 DIAGNOSIS — Z79899 Other long term (current) drug therapy: Secondary | ICD-10-CM | POA: Insufficient documentation

## 2014-03-21 DIAGNOSIS — Z87891 Personal history of nicotine dependence: Secondary | ICD-10-CM | POA: Insufficient documentation

## 2014-03-21 DIAGNOSIS — Z8742 Personal history of other diseases of the female genital tract: Secondary | ICD-10-CM | POA: Insufficient documentation

## 2014-03-21 LAB — RAPID STREP SCREEN (MED CTR MEBANE ONLY): STREPTOCOCCUS, GROUP A SCREEN (DIRECT): NEGATIVE

## 2014-03-21 MED ORDER — FEXOFENADINE-PSEUDOEPHED ER 60-120 MG PO TB12: 1.0000 | ORAL_TABLET | Freq: Two times a day (BID) | ORAL | Status: DC

## 2014-03-21 NOTE — Discharge Instructions (Signed)
Pharyngitis °Pharyngitis is redness, pain, and swelling (inflammation) of your pharynx.  °CAUSES  °Pharyngitis is usually caused by infection. Most of the time, these infections are from viruses (viral) and are part of a cold. However, sometimes pharyngitis is caused by bacteria (bacterial). Pharyngitis can also be caused by allergies. Viral pharyngitis may be spread from person to person by coughing, sneezing, and personal items or utensils (cups, forks, spoons, toothbrushes). Bacterial pharyngitis may be spread from person to person by more intimate contact, such as kissing.  °SIGNS AND SYMPTOMS  °Symptoms of pharyngitis include:   °· Sore throat.   °· Tiredness (fatigue).   °· Low-grade fever.   °· Headache. °· Joint pain and muscle aches. °· Skin rashes. °· Swollen lymph nodes. °· Plaque-like film on throat or tonsils (often seen with bacterial pharyngitis). °DIAGNOSIS  °Your health care provider will ask you questions about your illness and your symptoms. Your medical history, along with a physical exam, is often all that is needed to diagnose pharyngitis. Sometimes, a rapid strep test is done. Other lab tests may also be done, depending on the suspected cause.  °TREATMENT  °Viral pharyngitis will usually get better in 3 4 days without the use of medicine. Bacterial pharyngitis is treated with medicines that kill germs (antibiotics).  °HOME CARE INSTRUCTIONS  °· Drink enough water and fluids to keep your urine clear or pale yellow.   °· Only take over-the-counter or prescription medicines as directed by your health care provider:   °· If you are prescribed antibiotics, make sure you finish them even if you start to feel better.   °· Do not take aspirin.   °· Get lots of rest.   °· Gargle with 8 oz of salt water (½ tsp of salt per 1 qt of water) as often as every 1 2 hours to soothe your throat.   °· Throat lozenges (if you are not at risk for choking) or sprays may be used to soothe your throat. °SEEK MEDICAL  CARE IF:  °· You have large, tender lumps in your neck. °· You have a rash. °· You cough up green, yellow-brown, or bloody spit. °SEEK IMMEDIATE MEDICAL CARE IF:  °· Your neck becomes stiff. °· You drool or are unable to swallow liquids. °· You vomit or are unable to keep medicines or liquids down. °· You have severe pain that does not go away with the use of recommended medicines. °· You have trouble breathing (not caused by a stuffy nose). °MAKE SURE YOU:  °· Understand these instructions. °· Will watch your condition. °· Will get help right away if you are not doing well or get worse. °Document Released: 11/13/2005 Document Revised: 09/03/2013 Document Reviewed: 07/21/2013 °[employer]® Patient Information ©2014 [employer]. ° °

## 2014-03-21 NOTE — ED Provider Notes (Signed)
CSN: [id number]     Arrival date & time 03/21/14  1656 History  This chart was scribed for Malvin Johns, MD by Marcha Dutton, ED Scribe. This patient was seen in room MH02/MH02 and the patient's care was started at 5:17 PM.    Chief Complaint  Patient presents with  . Sore Throat      The history is provided by the patient. No language interpreter was used.  HPI Comments: Jennifer Riddle is a 28 y.o. female who presents to the Emergency Department complaining of a sore throat at began 2 days ago. She reports associated fever (self reported as 102 F at highest), headache, congestion, and rhinorrhea. Pt denies chest congestion, abdominal pain, nausea, vomiting, diarrhea, and rashes. She reports talking and moving her jaw worsens the pain. Pt reports using tylenol with minimal results. Pt states she's unsure if she's been around any sick contacts.   Past Medical History  Diagnosis Date  . Fibroid tumor    Past Surgical History  Procedure Laterality Date  . Knee surgery     No family history on file. History  Substance Use Topics  . Smoking status: Former Research scientist (life sciences)  . Smokeless tobacco: Never Used  . Alcohol Use: No   OB History   Grav Para Term Preterm Abortions TAB SAB Ect Mult Living                 Review of Systems  Constitutional: Positive for fever. Negative for chills, diaphoresis and fatigue.  HENT: Positive for sore throat. Negative for congestion, rhinorrhea and sneezing.   Eyes: Negative.   Respiratory: Negative for cough, chest tightness and shortness of breath.   Cardiovascular: Negative for chest pain and leg swelling.  Gastrointestinal: Negative for nausea, vomiting, abdominal pain, diarrhea and blood in stool.  Genitourinary: Negative for frequency, hematuria, flank pain and difficulty urinating.  Musculoskeletal: Negative for arthralgias and back pain.  Skin: Negative for rash.  Neurological: Negative for dizziness, speech difficulty, weakness, numbness  and headaches.      Allergies  Sulfa antibiotics  Home Medications   Prior to Admission medications   Medication Sig Start Date End Date Taking? Authorizing Provider  acetaminophen (TYLENOL) 500 MG tablet Take 1,000 mg by mouth every 6 (six) hours as needed.   Yes Historical Provider, MD  guaiFENesin-codeine (ROBITUSSIN AC) 100-10 MG/5ML syrup Take 5 mLs by mouth 3 (three) times daily as needed for cough. 11/27/12   Saddie Benders. Ghim, MD  HYDROcodone-acetaminophen (NORCO/VICODIN) 5-325 MG per tablet Take 1 tablet by mouth every 4 (four) hours as needed for pain. 02/15/13   Larene Pickett, PA-C  oseltamivir (TAMIFLU) 75 MG capsule Take 1 capsule (75 mg total) by mouth every 12 (twelve) hours. 11/27/12   Saddie Benders. Ghim, MD   Triage Vitals: BP 132/93  Pulse 99  Temp(Src) 99.3 F (37.4 C) (Oral)  Resp 18  Ht 5\' 5"  (1.651 m)  Wt 105 lb (47.628 kg)  BMI 17.47 kg/m2  SpO2 100%  LMP 03/07/2014  Physical Exam  Nursing note and vitals reviewed. Constitutional: She is oriented to person, place, and time. She appears well-developed and well-nourished.  HENT:  Head: Normocephalic and atraumatic.  Right Ear: External ear normal.  Left Ear: External ear normal.  Mouth/Throat: Uvula is midline. No trismus in the jaw. Posterior oropharyngeal erythema present. No oropharyngeal exudate.  Mild erythema to the posterior pharynx,  Eyes: Pupils are equal, round, and reactive to light.  Neck: Normal range of motion.  Neck supple.  Cardiovascular: Normal rate, regular rhythm and normal heart sounds.   Pulmonary/Chest: Effort normal and breath sounds normal. No respiratory distress. She has no wheezes. She has no rales. She exhibits no tenderness.  Abdominal: Soft. Bowel sounds are normal. There is no tenderness. There is no rebound and no guarding.  Musculoskeletal: Normal range of motion. She exhibits no edema.  Lymphadenopathy:    She has no cervical adenopathy.  Neurological: She is alert and  oriented to person, place, and time.  Skin: Skin is warm and dry. No rash noted.  Psychiatric: She has a normal mood and affect.    ED Course  Procedures (including critical care time)  DIAGNOSTIC STUDIES: Oxygen Saturation is 100% on RA, normal by my interpretation.    COORDINATION OF CARE: 5:22 PM- Pt advised of plan for treatment and pt agrees.    Labs Review Labs Reviewed  RAPID STREP SCREEN  CULTURE, GROUP A STREP    Imaging Review No results found.   EKG Interpretation None      MDM   Final diagnoses:  Pharyngitis    Pt well appearing with likely URI.  Started on allegra D.  No airway compromise.  No throat swelling/suggestions of PTA.  I personally performed the services described in this documentation, which was scribed in my presence.  The recorded information has been reviewed and considered.    Malvin Johns, MD 03/21/14 [phone]

## 2014-03-21 NOTE — ED Notes (Signed)
Sore throat and fever x2 days.

## 2014-03-23 LAB — CULTURE, GROUP A STREP

## 2014-05-01 ENCOUNTER — Encounter (HOSPITAL_BASED_OUTPATIENT_CLINIC_OR_DEPARTMENT_OTHER): Payer: Self-pay | Admitting: Emergency Medicine

## 2014-05-01 ENCOUNTER — Emergency Department (HOSPITAL_BASED_OUTPATIENT_CLINIC_OR_DEPARTMENT_OTHER)
Admission: EM | Admit: 2014-05-01 | Discharge: 2014-05-01 | Disposition: A | Discharge status: Home / Self Care | Payer: Self-pay | Attending: Emergency Medicine | Admitting: Emergency Medicine

## 2014-05-01 DIAGNOSIS — Z792 Long term (current) use of antibiotics: Secondary | ICD-10-CM | POA: Insufficient documentation

## 2014-05-01 DIAGNOSIS — Z8742 Personal history of other diseases of the female genital tract: Secondary | ICD-10-CM | POA: Insufficient documentation

## 2014-05-01 DIAGNOSIS — Z87891 Personal history of nicotine dependence: Secondary | ICD-10-CM | POA: Insufficient documentation

## 2014-05-01 DIAGNOSIS — Z79899 Other long term (current) drug therapy: Secondary | ICD-10-CM | POA: Insufficient documentation

## 2014-05-01 DIAGNOSIS — H8110 Benign paroxysmal vertigo, unspecified ear: Secondary | ICD-10-CM | POA: Insufficient documentation

## 2014-05-01 MED ORDER — MECLIZINE HCL 50 MG PO TABS: 50.0000 mg | ORAL_TABLET | Freq: Three times a day (TID) | ORAL | Status: DC | PRN

## 2014-05-01 NOTE — ED Provider Notes (Signed)
CSN: [id number]     Arrival date & time 05/01/14  1138 History   First MD Initiated Contact with Patient 05/01/14 1153     Chief Complaint  Patient presents with  . Dizziness     (Consider location/radiation/quality/duration/timing/severity/associated sxs/prior Treatment) Patient is a 28 y.o. female presenting with dizziness. The history is provided by the patient.  Dizziness Quality:  Head spinning, imbalance and vertigo Severity:  Moderate Onset quality:  Unable to specify Timing:  Intermittent Progression:  Waxing and waning Chronicity:  New Context: head movement   Relieved by:  Nothing Worsened by:  Nothing tried Ineffective treatments:  None tried Associated symptoms: nausea   Associated symptoms: no blood in stool, no chest pain, no diarrhea, no headaches, no hearing loss, no palpitations, no shortness of breath, no syncope, no tinnitus, no vision changes, no vomiting and no weakness   Risk factors: hx of vertigo     Past Medical History  Diagnosis Date  . Fibroid tumor    Past Surgical History  Procedure Laterality Date  . Knee surgery     No family history on file. History  Substance Use Topics  . Smoking status: Former Research scientist (life sciences)  . Smokeless tobacco: Never Used  . Alcohol Use: No   OB History   Grav Para Term Preterm Abortions TAB SAB Ect Mult Living                 Review of Systems  HENT: Negative for hearing loss and tinnitus.   Respiratory: Negative for shortness of breath.   Cardiovascular: Negative for chest pain, palpitations and syncope.  Gastrointestinal: Positive for nausea. Negative for vomiting, diarrhea and blood in stool.  Neurological: Positive for dizziness. Negative for headaches.  All other systems reviewed and are negative.     Allergies  Sulfa antibiotics  Home Medications   Prior to Admission medications   Medication Sig Start Date End Date Taking? Authorizing Provider  acetaminophen (TYLENOL) 500 MG tablet Take 1,000 mg by  mouth every 6 (six) hours as needed.    Historical Provider, MD  fexofenadine-pseudoephedrine (ALLEGRA-D) 60-120 MG per tablet Take 1 tablet by mouth every 12 (twelve) hours. 03/21/14   Malvin Johns, MD  guaiFENesin-codeine (ROBITUSSIN AC) 100-10 MG/5ML syrup Take 5 mLs by mouth 3 (three) times daily as needed for cough. 11/27/12   Saddie Benders. Ghim, MD  HYDROcodone-acetaminophen (NORCO/VICODIN) 5-325 MG per tablet Take 1 tablet by mouth every 4 (four) hours as needed for pain. 02/15/13   Larene Pickett, PA-C  oseltamivir (TAMIFLU) 75 MG capsule Take 1 capsule (75 mg total) by mouth every 12 (twelve) hours. 11/27/12   Saddie Benders. Ghim, MD   BP 136/91  Pulse 88  Temp(Src) 98.2 F (36.8 C) (Oral)  Resp 18  Ht 5\' 5"  (1.651 m)  Wt 105 lb (47.628 kg)  BMI 17.47 kg/m2  SpO2 100%  LMP 04/29/2014 Physical Exam  Nursing note and vitals reviewed. Constitutional: She is oriented to person, place, and time. She appears well-developed and well-nourished.  HENT:  Head: Normocephalic and atraumatic.  Right Ear: Tympanic membrane and external ear normal.  Left Ear: Tympanic membrane and external ear normal.  Nose: Nose normal. Right sinus exhibits no maxillary sinus tenderness and no frontal sinus tenderness. Left sinus exhibits no maxillary sinus tenderness and no frontal sinus tenderness.  Eyes: Conjunctivae and EOM are normal. Pupils are equal, round, and reactive to light. Right eye exhibits no nystagmus. Left eye exhibits no nystagmus.  Neck: Normal range  of motion. Neck supple.  Cardiovascular: Normal rate, regular rhythm, normal heart sounds and intact distal pulses.   Pulmonary/Chest: Effort normal and breath sounds normal. No respiratory distress. She exhibits no tenderness.  Abdominal: Soft. Bowel sounds are normal. She exhibits no distension and no mass. There is no tenderness.  Musculoskeletal: Normal range of motion. She exhibits no edema and no tenderness.  Neurological: She is alert and oriented  to person, place, and time. She has normal strength and normal reflexes. No sensory deficit. She displays a negative Romberg sign. GCS eye subscore is 4. GCS verbal subscore is 5. GCS motor subscore is 6.  Reflex Scores:      Tricep reflexes are 2+ on the right side and 2+ on the left side.      Bicep reflexes are 2+ on the right side and 2+ on the left side.      Brachioradialis reflexes are 2+ on the right side and 2+ on the left side.      Patellar reflexes are 2+ on the right side and 2+ on the left side.      Achilles reflexes are 2+ on the right side and 2+ on the left side. Speech is normal without dysarthria, dysphasia, or aphasia. Muscle strength is 5/5 in bilateral shoulders, elbow flexor and extensors, wrist flexor and extensors, and intrinsic hand muscles. 5/5 bilateral lower extremity hip flexors, extensors, knee flexors and extensors, and ankle dorsi and plantar flexors.  Fatiguable nystagmus to left, symptoms increase withhead turned to right.  Symptoms reproducible with dix-hallpike maneuver.  Patient with some difficulty with balance with walking.     Skin: Skin is warm and dry. No rash noted.  Psychiatric: She has a normal mood and affect. Her behavior is normal. Judgment and thought content normal.    ED Course  Procedures (including critical care time) Labs Review Labs Reviewed - No data to display  Imaging Review No results found.   EKG Interpretation None      MDM   Final diagnoses:  BPPV (benign paroxysmal positional vertigo)    28 y.o. Female with two history of vertigo c.w. peripheral vertigo with ulilateral nystagmus that fatigues, no other neurologic signs but some staggering towards right.  Patient with symptoms most consistent with bppv with symptoms resolvig with head held still and worsens with movement although cannont rule out vestibular neuritis as patient with some gait instability.  No auditory  Loss is noted.  Patient with normal ear exam  without evidence of zoster and no history to suggeges concussion or fistula.    Shaune Pollack, MD 05/01/14 [phone]

## 2014-05-01 NOTE — Discharge Instructions (Signed)

## 2014-05-01 NOTE — ED Notes (Addendum)
Dizziness x 3 days. States the room is spinning. Nausea. Hx of URI a few weeks ago.

## 2014-08-04 ENCOUNTER — Encounter (HOSPITAL_BASED_OUTPATIENT_CLINIC_OR_DEPARTMENT_OTHER): Payer: Self-pay | Admitting: Emergency Medicine

## 2014-08-04 ENCOUNTER — Emergency Department (HOSPITAL_BASED_OUTPATIENT_CLINIC_OR_DEPARTMENT_OTHER)
Admission: EM | Admit: 2014-08-04 | Discharge: 2014-08-04 | Disposition: A | Discharge status: Home / Self Care | Payer: Self-pay | Attending: Emergency Medicine | Admitting: Emergency Medicine

## 2014-08-04 DIAGNOSIS — R42 Dizziness and giddiness: Secondary | ICD-10-CM | POA: Insufficient documentation

## 2014-08-04 DIAGNOSIS — R5383 Other fatigue: Secondary | ICD-10-CM

## 2014-08-04 DIAGNOSIS — Z79899 Other long term (current) drug therapy: Secondary | ICD-10-CM | POA: Insufficient documentation

## 2014-08-04 DIAGNOSIS — R5381 Other malaise: Secondary | ICD-10-CM | POA: Insufficient documentation

## 2014-08-04 DIAGNOSIS — Z8742 Personal history of other diseases of the female genital tract: Secondary | ICD-10-CM | POA: Insufficient documentation

## 2014-08-04 DIAGNOSIS — Z3202 Encounter for pregnancy test, result negative: Secondary | ICD-10-CM | POA: Insufficient documentation

## 2014-08-04 LAB — CBC WITH DIFFERENTIAL/PLATELET
BASOS ABS: 0 10*3/uL (ref 0.0–0.1)
BASOS PCT: 0 % (ref 0–1)
EOS ABS: 0.1 10*3/uL (ref 0.0–0.7)
EOS PCT: 1 % (ref 0–5)
HEMATOCRIT: 36.4 % (ref 36.0–46.0)
Hemoglobin: 12.3 g/dL (ref 12.0–15.0)
Lymphocytes Relative: 25 % (ref 12–46)
Lymphs Abs: 2.5 10*3/uL (ref 0.7–4.0)
MCH: 27.7 pg (ref 26.0–34.0)
MCHC: 33.8 g/dL (ref 30.0–36.0)
MCV: 82 fL (ref 78.0–100.0)
MONO ABS: 0.8 10*3/uL (ref 0.1–1.0)
Monocytes Relative: 8 % (ref 3–12)
Neutro Abs: 6.5 10*3/uL (ref 1.7–7.7)
Neutrophils Relative %: 66 % (ref 43–77)
PLATELETS: 208 10*3/uL (ref 150–400)
RBC: 4.44 MIL/uL (ref 3.87–5.11)
RDW: 14.1 % (ref 11.5–15.5)
WBC: 9.8 10*3/uL (ref 4.0–10.5)

## 2014-08-04 LAB — URINALYSIS, ROUTINE W REFLEX MICROSCOPIC
BILIRUBIN URINE: NEGATIVE
Glucose, UA: NEGATIVE mg/dL
Hgb urine dipstick: NEGATIVE
KETONES UR: NEGATIVE mg/dL
LEUKOCYTES UA: NEGATIVE
NITRITE: NEGATIVE
Protein, ur: NEGATIVE mg/dL
SPECIFIC GRAVITY, URINE: 1.019 (ref 1.005–1.030)
UROBILINOGEN UA: 1 mg/dL (ref 0.0–1.0)
pH: 7 (ref 5.0–8.0)

## 2014-08-04 LAB — PREGNANCY, URINE: Preg Test, Ur: NEGATIVE

## 2014-08-04 LAB — CBG MONITORING, ED: GLUCOSE-CAPILLARY: 83 mg/dL (ref 70–99)

## 2014-08-04 LAB — BASIC METABOLIC PANEL
ANION GAP: 10 (ref 5–15)
BUN: 8 mg/dL (ref 6–23)
CALCIUM: 9.4 mg/dL (ref 8.4–10.5)
CO2: 23 mEq/L (ref 19–32)
CREATININE: 0.8 mg/dL (ref 0.50–1.10)
Chloride: 107 mEq/L (ref 96–112)
Glucose, Bld: 115 mg/dL — ABNORMAL HIGH (ref 70–99)
Potassium: 4.4 mEq/L (ref 3.7–5.3)
Sodium: 140 mEq/L (ref 137–147)

## 2014-08-04 NOTE — Discharge Instructions (Signed)

## 2014-08-04 NOTE — ED Notes (Signed)
Pt c/o sudden onset of dizziness, SOB and fatigue x 1 hr

## 2014-08-04 NOTE — ED Provider Notes (Signed)
CSN: [id number]     Arrival date & time 08/04/14  1419 History   First MD Initiated Contact with Patient 08/04/14 1426     Chief Complaint  Patient presents with  . Dizziness     (Consider location/radiation/quality/duration/timing/severity/associated sxs/prior Treatment) HPI Comments: Patient is a 28 year old female with history of uterine fibroids. She presents today with complaints of dizziness that started suddenly this morning while she was taking a shower. She states she felt like she couldn't catch her breath and open the door to let some air in. She then became dizzy and felt as if she was going to pass out. This sensation past, however she has not felt well the rest of the day. She denies to me she is having any chest pain or difficulty breathing. She denies any fevers or chills.  Patient is a 28 y.o. female presenting with dizziness. The history is provided by the patient.  Dizziness Quality:  Lightheadedness Severity:  Moderate Onset quality:  Sudden Timing:  Constant Progression:  Partially resolved Chronicity:  New Context: not when bending over and not with inactivity   Relieved by:  Nothing Worsened by:  Nothing tried   Past Medical History  Diagnosis Date  . Fibroid tumor    Past Surgical History  Procedure Laterality Date  . Knee surgery     History reviewed. No pertinent family history. History  Substance Use Topics  . Smoking status: Former Research scientist (life sciences)  . Smokeless tobacco: Never Used  . Alcohol Use: No   OB History   Grav Para Term Preterm Abortions TAB SAB Ect Mult Living                 Review of Systems  Constitutional: Positive for fatigue. Negative for fever and chills.  Neurological: Positive for dizziness.  All other systems reviewed and are negative.     Allergies  Sulfa antibiotics  Home Medications   Prior to Admission medications   Medication Sig Start Date End Date Taking? Authorizing Provider  acetaminophen (TYLENOL) 500 MG tablet  Take 1,000 mg by mouth every 6 (six) hours as needed.    Historical Provider, MD  fexofenadine-pseudoephedrine (ALLEGRA-D) 60-120 MG per tablet Take 1 tablet by mouth every 12 (twelve) hours. 03/21/14   Malvin Johns, MD  guaiFENesin-codeine (ROBITUSSIN AC) 100-10 MG/5ML syrup Take 5 mLs by mouth 3 (three) times daily as needed for cough. 11/27/12   Saddie Benders. Ghim, MD  HYDROcodone-acetaminophen (NORCO/VICODIN) 5-325 MG per tablet Take 1 tablet by mouth every 4 (four) hours as needed for pain. 02/15/13   Larene Pickett, PA-C  meclizine (ANTIVERT) 50 MG tablet Take 1 tablet (50 mg total) by mouth 3 (three) times daily as needed. 05/01/14   Shaune Pollack, MD  oseltamivir (TAMIFLU) 75 MG capsule Take 1 capsule (75 mg total) by mouth every 12 (twelve) hours. 11/27/12   Saddie Benders. Ghim, MD   BP 124/73  Pulse 78  Temp(Src) 99.1 F (37.3 C) (Oral)  Resp 16  Ht 5\' 5"  (1.651 m)  Wt 105 lb (47.628 kg)  BMI 17.47 kg/m2  SpO2 99%  LMP 07/14/2014 Physical Exam  Nursing note and vitals reviewed. Constitutional: She is oriented to person, place, and time. She appears well-developed and well-nourished. No distress.  HENT:  Head: Normocephalic and atraumatic.  Neck: Normal range of motion. Neck supple.  Cardiovascular: Normal rate and regular rhythm.  Exam reveals no gallop and no friction rub.   No murmur heard. Pulmonary/Chest: Effort normal and breath  sounds normal. No respiratory distress. She has no wheezes.  Abdominal: Soft. Bowel sounds are normal. She exhibits no distension. There is no tenderness.  Musculoskeletal: Normal range of motion.  Neurological: She is alert and oriented to person, place, and time. No cranial nerve deficit. She exhibits normal muscle tone. Coordination normal.  Skin: Skin is warm and dry. She is not diaphoretic.    ED Course  Procedures (including critical care time) Labs Review Labs Reviewed  URINALYSIS, ROUTINE W REFLEX MICROSCOPIC  PREGNANCY, URINE  CBC WITH  DIFFERENTIAL  BASIC METABOLIC PANEL  CBG MONITORING, ED    Imaging Review No results found.   EKG Interpretation   Date/Time:  Tuesday August 04 2014 14:50:00 EDT Ventricular Rate:  64 PR Interval:  136 QRS Duration: 72 QT Interval:  378 QTC Calculation: 389 R Axis:   64 Text Interpretation:  Normal sinus rhythm Abnormal QRS-T angle, consider  primary T wave abnormality Abnormal ECG Confirmed by DELOS  MD, Rayen Palen  ([id number]) on 08/04/2014 3:33:15 PM      MDM   Final diagnoses:  None    Workup reveals nonspecific T wave changes on EKG and otherwise unremarkable laboratory studies. Her neurologic exam is nonfocal and she appears quite well. I think she is appropriate for discharge with when necessary followup.    Veryl Speak, MD 08/04/14 1534

## 2014-10-14 ENCOUNTER — Encounter (HOSPITAL_BASED_OUTPATIENT_CLINIC_OR_DEPARTMENT_OTHER): Payer: Self-pay | Admitting: *Deleted

## 2014-10-14 ENCOUNTER — Emergency Department (HOSPITAL_BASED_OUTPATIENT_CLINIC_OR_DEPARTMENT_OTHER)
Admission: EM | Admit: 2014-10-14 | Discharge: 2014-10-14 | Disposition: A | Discharge status: Home / Self Care | Payer: Self-pay | Attending: Emergency Medicine | Admitting: Emergency Medicine

## 2014-10-14 DIAGNOSIS — B349 Viral infection, unspecified: Secondary | ICD-10-CM

## 2014-10-14 DIAGNOSIS — J069 Acute upper respiratory infection, unspecified: Secondary | ICD-10-CM

## 2014-10-14 DIAGNOSIS — Z79899 Other long term (current) drug therapy: Secondary | ICD-10-CM | POA: Insufficient documentation

## 2014-10-14 DIAGNOSIS — Z87891 Personal history of nicotine dependence: Secondary | ICD-10-CM | POA: Insufficient documentation

## 2014-10-14 DIAGNOSIS — B9789 Other viral agents as the cause of diseases classified elsewhere: Secondary | ICD-10-CM

## 2014-10-14 DIAGNOSIS — Z86018 Personal history of other benign neoplasm: Secondary | ICD-10-CM | POA: Insufficient documentation

## 2014-10-14 MED ORDER — ACETAMINOPHEN-CODEINE #3 300-30 MG PO TABS: 1.0000 | ORAL_TABLET | Freq: Four times a day (QID) | ORAL | Status: DC | PRN

## 2014-10-14 MED ORDER — SALINE SPRAY 0.65 % NA SOLN: 1.0000 | NASAL | Status: DC | PRN

## 2014-10-14 MED ORDER — BENZONATATE 100 MG PO CAPS: 100.0000 mg | ORAL_CAPSULE | Freq: Three times a day (TID) | ORAL | Status: DC

## 2014-10-14 NOTE — ED Provider Notes (Addendum)
CSN: [id number]     Arrival date & time 10/14/14  1051 History   First MD Initiated Contact with Patient 10/14/14 1133     Chief Complaint  Patient presents with  . Cough     (Consider location/radiation/quality/duration/timing/severity/associated sxs/prior Treatment) Patient is a 28 y.o. female presenting with cough. The history is provided by the patient.  Cough Cough characteristics:  Non-productive and productive Sputum characteristics:  Clear and yellow Severity:  Moderate Duration:  3 days Timing:  Constant Progression:  Worsening Chronicity:  New Smoker: no   Relieved by:  Nothing Associated symptoms: chest pain, chills, fever, headaches, rhinorrhea, sore throat and wheezing   Associated symptoms: no shortness of breath     Past Medical History  Diagnosis Date  . Fibroid tumor    Past Surgical History  Procedure Laterality Date  . Knee surgery     No family history on file. History  Substance Use Topics  . Smoking status: Former Research scientist (life sciences)  . Smokeless tobacco: Never Used  . Alcohol Use: No   OB History    No data available     Review of Systems  Constitutional: Positive for fever, chills, activity change and fatigue.  HENT: Positive for postnasal drip, rhinorrhea and sore throat. Negative for trouble swallowing and voice change.   Respiratory: Positive for cough and wheezing. Negative for shortness of breath.   Cardiovascular: Positive for chest pain.  Gastrointestinal: Negative for nausea, vomiting and abdominal pain.  Genitourinary: Negative for dysuria.  Musculoskeletal: Negative for neck pain.  Neurological: Positive for weakness and headaches.      Allergies  Sulfa antibiotics  Home Medications   Prior to Admission medications   Medication Sig Start Date End Date Taking? Authorizing Provider  acetaminophen (TYLENOL) 500 MG tablet Take 1,000 mg by mouth every 6 (six) hours as needed.    Historical Provider, MD  acetaminophen-codeine (TYLENOL  #3) 300-30 MG per tablet Take 1-2 tablets by mouth every 6 (six) hours as needed. 10/14/14   Varney Biles, MD  benzonatate (TESSALON) 100 MG capsule Take 1 capsule (100 mg total) by mouth every 8 (eight) hours. 10/14/14   Varney Biles, MD  fexofenadine-pseudoephedrine (ALLEGRA-D) 60-120 MG per tablet Take 1 tablet by mouth every 12 (twelve) hours. 03/21/14   Malvin Johns, MD  guaiFENesin-codeine (ROBITUSSIN AC) 100-10 MG/5ML syrup Take 5 mLs by mouth 3 (three) times daily as needed for cough. 11/27/12   Saddie Benders. Ghim, MD  HYDROcodone-acetaminophen (NORCO/VICODIN) 5-325 MG per tablet Take 1 tablet by mouth every 4 (four) hours as needed for pain. 02/15/13   Larene Pickett, PA-C  meclizine (ANTIVERT) 50 MG tablet Take 1 tablet (50 mg total) by mouth 3 (three) times daily as needed. 05/01/14   Shaune Pollack, MD  oseltamivir (TAMIFLU) 75 MG capsule Take 1 capsule (75 mg total) by mouth every 12 (twelve) hours. 11/27/12   Saddie Benders. Ghim, MD  sodium chloride (OCEAN) 0.65 % SOLN nasal spray Place 1 spray into both nostrils as needed for congestion. 10/14/14   Azell Bill, MD   BP 139/86 mmHg  Pulse 118  Temp(Src) 99.8 F (37.7 C) (Oral)  Resp 16  Ht 5\' 5"  (1.651 m)  Wt 105 lb (47.628 kg)  BMI 17.47 kg/m2  SpO2 100%  LMP 09/27/2014 Physical Exam  Constitutional: She is oriented to person, place, and time. She appears well-developed and well-nourished.  HENT:  Head: Normocephalic and atraumatic.  Mouth/Throat: No oropharyngeal exudate.  Eyes: EOM are normal. Pupils are  equal, round, and reactive to light.  Neck: Neck supple.  Cardiovascular: Normal rate, regular rhythm and normal heart sounds.   No murmur heard. Pulmonary/Chest: Effort normal. No stridor. No respiratory distress. She has no wheezes.  Abdominal: Soft. She exhibits no distension. There is no tenderness. There is no rebound and no guarding.  Lymphadenopathy:    She has cervical adenopathy.  Neurological: She is alert and  oriented to person, place, and time.  Skin: Skin is warm and dry.  Nursing note and vitals reviewed.   ED Course  Procedures (including critical care time) Labs Review Labs Reviewed - No data to display  Imaging Review No results found.   EKG Interpretation None      MDM   Final diagnoses:  Viral URI with cough  Viral syndrome   DDX includes: Viral syndrome Influenza Pharyngitis Sinusitis Mononucleosis  Pt comes in with cc of cough, fevers, headaches, malaise, body aches, and uri like sx. Clinically appears to be a viral syndrome. Not a candidate for tamiflu. Will discharge.   Varney Biles, MD 10/14/14 [place], MD 10/14/14 1314

## 2014-10-14 NOTE — Discharge Instructions (Signed)
We saw you in the ER for the cough, low fevers, runny nose, malaise. We think what you have is a viral syndrome - the treatment for which is symptomatic relief only, and your body will fight the infection off in a few days. We are prescribing you some meds for pain and fevers. See your primary care doctor in 1 week if the symptoms dont improve.   Cool Mist Vaporizers Vaporizers may help relieve the symptoms of a cough and cold. They add moisture to the air, which helps mucus to become thinner and less sticky. This makes it easier to breathe and cough up secretions. Cool mist vaporizers do not cause serious burns like hot mist vaporizers, which may also be called steamers or humidifiers. Vaporizers have not been proven to help with colds. You should not use a vaporizer if you are allergic to mold. HOME CARE INSTRUCTIONS  Follow the package instructions for the vaporizer.  Do not use anything other than distilled water in the vaporizer.  Do not run the vaporizer all of the time. This can cause mold or bacteria to grow in the vaporizer.  Clean the vaporizer after each time it is used.  Clean and dry the vaporizer well before storing it.  Stop using the vaporizer if worsening respiratory symptoms develop. Document Released: 08/10/2004 Document Revised: 11/18/2013 Document Reviewed: 04/02/2013 [employer] Patient Information 2015 [place], [place]. This information is not intended to replace advice given to you by your health care provider. Make sure you discuss any questions you have with your health care provider.  Upper Respiratory Infection, Adult An upper respiratory infection (URI) is also known as the common cold. It is often caused by a type of germ (virus). Colds are easily spread (contagious). You can pass it to others by kissing, coughing, sneezing, or drinking out of the same glass. Usually, you get better in 1 or 2 weeks.  HOME CARE   Only take medicine as told by your doctor.  Use a  warm mist humidifier or breathe in steam from a hot shower.  Drink enough water and fluids to keep your pee (urine) clear or pale yellow.  Get plenty of rest.  Return to work when your temperature is back to normal or as told by your doctor. You may use a face mask and wash your hands to stop your cold from spreading. GET HELP RIGHT AWAY IF:   After the first few days, you feel you are getting worse.  You have questions about your medicine.  You have chills, shortness of breath, or brown or red spit (mucus).  You have yellow or brown snot (nasal discharge) or pain in the face, especially when you bend forward.  You have a fever, puffy (swollen) neck, pain when you swallow, or white spots in the back of your throat.  You have a bad headache, ear pain, sinus pain, or chest pain.  You have a high-pitched whistling sound when you breathe in and out (wheezing).  You have a lasting cough or cough up blood.  You have sore muscles or a stiff neck. MAKE SURE YOU:   Understand these instructions.  Will watch your condition.  Will get help right away if you are not doing well or get worse. Document Released: 05/01/2008 Document Revised: 02/05/2012 Document Reviewed: 02/18/2014 [facility] Patient Information 2015 [place], [place]. This information is not intended to replace advice given to you by your health care provider. Make sure you discuss any questions you have with your health  care provider.  Viral Infections A viral infection can be caused by different types of viruses.Most viral infections are not serious and resolve on their own. However, some infections may cause severe symptoms and may lead to further complications. SYMPTOMS Viruses can frequently cause:  Minor sore throat.  Aches and pains.  Headaches.  Runny nose.  Different types of rashes.  Watery eyes.  Tiredness.  Cough.  Loss of appetite.  Gastrointestinal infections, resulting in nausea, vomiting, and  diarrhea. These symptoms do not respond to antibiotics because the infection is not caused by bacteria. However, you might catch a bacterial infection following the viral infection. This is sometimes called a "superinfection." Symptoms of such a bacterial infection may include:  Worsening sore throat with pus and difficulty swallowing.  Swollen neck glands.  Chills and a high or persistent fever.  Severe headache.  Tenderness over the sinuses.  Persistent overall ill feeling (malaise), muscle aches, and tiredness (fatigue).  Persistent cough.  Yellow, green, or brown mucus production with coughing. HOME CARE INSTRUCTIONS   Only take over-the-counter or prescription medicines for pain, discomfort, diarrhea, or fever as directed by your caregiver.  Drink enough water and fluids to keep your urine clear or pale yellow. Sports drinks can provide valuable electrolytes, sugars, and hydration.  Get plenty of rest and maintain proper nutrition. Soups and broths with crackers or rice are fine. SEEK IMMEDIATE MEDICAL CARE IF:   You have severe headaches, shortness of breath, chest pain, neck pain, or an unusual rash.  You have uncontrolled vomiting, diarrhea, or you are unable to keep down fluids.  You or your child has an oral temperature above 102 F (38.9 C), not controlled by medicine.  Your baby is older than 3 months with a rectal temperature of 102 F (38.9 C) or higher.  Your baby is 72 months old or younger with a rectal temperature of 100.4 F (38 C) or higher. MAKE SURE YOU:   Understand these instructions.  Will watch your condition.  Will get help right away if you are not doing well or get worse. Document Released: 08/23/2005 Document Revised: 02/05/2012 Document Reviewed: 03/20/2011 [facility] Patient Information 2015 [place], [place]. This information is not intended to replace advice given to you by your health care provider. Make sure you discuss any questions you  have with your health care provider.

## 2014-10-14 NOTE — ED Notes (Signed)
Patient has been taking tylenol and otc meds with no relief

## 2014-10-14 NOTE — ED Notes (Signed)
Cough, fever, headache chills x 2 days

## 2015-01-23 ENCOUNTER — Encounter (HOSPITAL_BASED_OUTPATIENT_CLINIC_OR_DEPARTMENT_OTHER): Payer: Self-pay

## 2015-01-23 ENCOUNTER — Emergency Department (HOSPITAL_BASED_OUTPATIENT_CLINIC_OR_DEPARTMENT_OTHER)
Admission: EM | Admit: 2015-01-23 | Discharge: 2015-01-23 | Disposition: A | Discharge status: Home / Self Care | Payer: Self-pay | Attending: Emergency Medicine | Admitting: Emergency Medicine

## 2015-01-23 DIAGNOSIS — Z3202 Encounter for pregnancy test, result negative: Secondary | ICD-10-CM | POA: Insufficient documentation

## 2015-01-23 DIAGNOSIS — B3731 Acute candidiasis of vulva and vagina: Secondary | ICD-10-CM

## 2015-01-23 DIAGNOSIS — B373 Candidiasis of vulva and vagina: Secondary | ICD-10-CM | POA: Insufficient documentation

## 2015-01-23 DIAGNOSIS — Z79899 Other long term (current) drug therapy: Secondary | ICD-10-CM | POA: Insufficient documentation

## 2015-01-23 DIAGNOSIS — Z87891 Personal history of nicotine dependence: Secondary | ICD-10-CM | POA: Insufficient documentation

## 2015-01-23 LAB — URINE MICROSCOPIC-ADD ON

## 2015-01-23 LAB — URINALYSIS, ROUTINE W REFLEX MICROSCOPIC
Bilirubin Urine: NEGATIVE
GLUCOSE, UA: NEGATIVE mg/dL
Hgb urine dipstick: NEGATIVE
Ketones, ur: NEGATIVE mg/dL
Nitrite: NEGATIVE
PH: 8 (ref 5.0–8.0)
PROTEIN: NEGATIVE mg/dL
SPECIFIC GRAVITY, URINE: 1.023 (ref 1.005–1.030)
UROBILINOGEN UA: 1 mg/dL (ref 0.0–1.0)

## 2015-01-23 LAB — WET PREP, GENITAL
Clue Cells Wet Prep HPF POC: NONE SEEN
Trich, Wet Prep: NONE SEEN

## 2015-01-23 LAB — PREGNANCY, URINE: Preg Test, Ur: NEGATIVE

## 2015-01-23 MED ORDER — MONISTAT 3 COMBINATION PACK VA KIT: 1.0000 | PACK | Freq: Every day | VAGINAL | Status: DC

## 2015-01-23 NOTE — Discharge Instructions (Signed)
Please follow with your primary care doctor in the next 2 days for a check-up. They must obtain records for further management.   Do not hesitate to return to the Emergency Department for any new, worsening or concerning symptoms.    Candidal Vulvovaginitis Candidal vulvovaginitis is an infection of the vagina and vulva. The vulva is the skin around the opening of the vagina. This may cause itching and discomfort in and around the vagina.  HOME CARE  Only take medicine as told by your doctor.  Do not have sex (intercourse) until the infection is healed or as told by your doctor.  Practice safe sex.  Tell your sex partner about your infection.  Do not douche or use tampons.  Wear cotton underwear. Do not wear tight pants or panty hose.  Eat yogurt. This may help treat and prevent yeast infections. GET HELP RIGHT AWAY IF:   You have a fever.  Your problems get worse during treatment or do not get better in 3 days.  You have discomfort, irritation, or itching in your vagina or vulva area.  You have pain after sex.  You start to get belly (abdominal) pain. MAKE SURE YOU:  Understand these instructions.  Will watch your condition.  Will get help right away if you are not doing well or get worse. Document Released: 02/09/2009 Document Revised: 11/18/2013 Document Reviewed: 02/09/2009 [facility] Patient Information 2015 [place], [place]. This information is not intended to replace advice given to you by your health care provider. Make sure you discuss any questions you have with your health care provider.

## 2015-01-23 NOTE — ED Provider Notes (Signed)
CSN: [id number]     Arrival date & time 01/23/15  1434 History   First MD Initiated Contact with Patient 01/23/15 1711     Chief Complaint  Patient presents with  . Vaginal Discharge     (Consider location/radiation/quality/duration/timing/severity/associated sxs/prior Treatment) HPI  Jennifer Riddle is a 29 y.o. female complaining of thin white vaginal discharge with mild pruritus to the perineal region onset 2 days ago. She denies abdominal pain, fever, chills, nausea, vomiting, dysuria, hematuria, concerns about STDs: States she is not sexually active.  Past Medical History  Diagnosis Date  . Fibroid tumor    Past Surgical History  Procedure Laterality Date  . Knee surgery     No family history on file. History  Substance Use Topics  . Smoking status: Former Research scientist (life sciences)  . Smokeless tobacco: Never Used  . Alcohol Use: No   OB History    No data available     Review of Systems   10 systems reviewed and found to be negative, except as noted in the HPI.   Allergies  Sulfa antibiotics  Home Medications   Prior to Admission medications   Medication Sig Start Date End Date Taking? Authorizing Provider  acetaminophen (TYLENOL) 500 MG tablet Take 1,000 mg by mouth every 6 (six) hours as needed.    Historical Provider, MD  acetaminophen-codeine (TYLENOL #3) 300-30 MG per tablet Take 1-2 tablets by mouth every 6 (six) hours as needed. 10/14/14   Varney Biles, MD  benzonatate (TESSALON) 100 MG capsule Take 1 capsule (100 mg total) by mouth every 8 (eight) hours. 10/14/14   Varney Biles, MD  fexofenadine-pseudoephedrine (ALLEGRA-D) 60-120 MG per tablet Take 1 tablet by mouth every 12 (twelve) hours. 03/21/14   Malvin Johns, MD  guaiFENesin-codeine (ROBITUSSIN AC) 100-10 MG/5ML syrup Take 5 mLs by mouth 3 (three) times daily as needed for cough. 11/27/12   Saddie Benders. Ghim, MD  HYDROcodone-acetaminophen (NORCO/VICODIN) 5-325 MG per tablet Take 1 tablet by mouth every 4 (four)  hours as needed for pain. 02/15/13   Larene Pickett, PA-C  meclizine (ANTIVERT) 50 MG tablet Take 1 tablet (50 mg total) by mouth 3 (three) times daily as needed. 05/01/14   Shaune Pollack, MD  MONISTAT 3 COMBINATION PACK KIT Place 1 each vaginally at bedtime. 01/23/15   Sieanna Braysen Cloward, PA-C  oseltamivir (TAMIFLU) 75 MG capsule Take 1 capsule (75 mg total) by mouth every 12 (twelve) hours. 11/27/12   Saddie Benders. Ghim, MD  sodium chloride (OCEAN) 0.65 % SOLN nasal spray Place 1 spray into both nostrils as needed for congestion. 10/14/14   Ankit Nanavati, MD   BP 122/77 mmHg  Pulse 76  Temp(Src) 98.3 F (36.8 C) (Oral)  Resp 16  Ht $R'5\' 5"'oz$  (1.651 m)  Wt 105 lb (47.628 kg)  BMI 17.47 kg/m2  SpO2 100%  LMP 01/04/2015 Physical Exam  Constitutional: She is oriented to person, place, and time. She appears well-developed and well-nourished. No distress.  HENT:  Head: Normocephalic and atraumatic.  Mouth/Throat: Oropharynx is clear and moist.  Eyes: Conjunctivae and EOM are normal. Pupils are equal, round, and reactive to light.  Neck: Normal range of motion.  Cardiovascular: Normal rate, regular rhythm and intact distal pulses.   Pulmonary/Chest: Effort normal and breath sounds normal. No stridor.  Abdominal: Soft. Bowel sounds are normal.  Genitourinary:  Pelvic exam is chaperoned by nurse: No rashes or lesions, there is a thin, profuse, gray and frothy vaginal discharge. No cervical motion or  adnexal tenderness.  Musculoskeletal: Normal range of motion. She exhibits no edema or tenderness.  Neurological: She is alert and oriented to person, place, and time.  Psychiatric: She has a normal mood and affect.  Nursing note and vitals reviewed.   ED Course  Procedures (including critical care time) Labs Review Labs Reviewed  WET PREP, GENITAL - Abnormal; Notable for the following:    Yeast Wet Prep HPF POC MANY (*)    WBC, Wet Prep HPF POC TOO NUMEROUS TO COUNT (*)    All other components  within normal limits  URINALYSIS, ROUTINE W REFLEX MICROSCOPIC - Abnormal; Notable for the following:    Leukocytes, UA MODERATE (*)    All other components within normal limits  URINE MICROSCOPIC-ADD ON - Abnormal; Notable for the following:    Bacteria, UA MANY (*)    All other components within normal limits  PREGNANCY, URINE  GC/CHLAMYDIA PROBE AMP ()    Imaging Review No results found.   EKG Interpretation None      MDM   Final diagnoses:  Vaginal yeast infection    Filed Vitals:   01/23/15 1451 01/23/15 1740 01/23/15 1921  BP: 121/80 118/81 122/77  Pulse: 87 71 76  Temp: 98.3 F (36.8 C)    TempSrc: Oral    Resp: $Remo'18 18 16  'AJfAT$ Height: $Rem'5\' 5"'roHx$  (1.651 m)    Weight: 105 lb (47.628 kg)    SpO2: 100% 100% 100%     Jennifer Riddle is a pleasant 30 y.o. female presenting with vaginal discharge and pruritis. Many yeast on wet prep. Offered pt Tx for cervicitis given the large number of WBCs on wet prep, and she declined. PE is not c/w PID. Will give Gyn follow up and monistat  Evaluation does not show pathology that would require ongoing emergent intervention or inpatient treatment. Pt is hemodynamically stable and mentating appropriately. Discussed findings and plan with patient/guardian, who agrees with care plan. All questions answered. Return precautions discussed and outpatient follow up given.   Discharge Medication List as of 01/23/2015  7:15 PM    START taking these medications   Details  MONISTAT 3 COMBINATION PACK KIT Place 1 each vaginally at bedtime., Starting 01/23/2015, Until Discontinued, Print             Deona Novitski, PA-C 01/25/15 [place], MD 01/26/15 [phone]

## 2015-01-23 NOTE — ED Notes (Signed)
Patient here with vaginal discharge x 2 days, denies urinary symptoms

## 2015-01-25 LAB — GC/CHLAMYDIA PROBE AMP (~~LOC~~) NOT AT ARMC
Chlamydia: POSITIVE — AB
Neisseria Gonorrhea: NEGATIVE

## 2015-01-26 ENCOUNTER — Telehealth (HOSPITAL_COMMUNITY): Payer: Self-pay

## 2015-01-26 NOTE — ED Notes (Signed)
Positive for chlamydia- chart sent to [place] office for review.

## 2015-01-28 ENCOUNTER — Telehealth (HOSPITAL_COMMUNITY): Payer: Self-pay

## 2015-01-28 NOTE — Telephone Encounter (Signed)
Chart reviewed by Dr B. Mingo Amber "Please prescribe 2 g of Azithromycin po x 1"  3/3 @ 17:12 Pt informed of dx, additional tx needed, notify partner(s) for testing and tx.  Rx called and given to [facility] [place].  [facility] completed and faxed.

## 2015-03-03 ENCOUNTER — Encounter (HOSPITAL_BASED_OUTPATIENT_CLINIC_OR_DEPARTMENT_OTHER): Payer: Self-pay

## 2015-03-03 ENCOUNTER — Emergency Department (HOSPITAL_BASED_OUTPATIENT_CLINIC_OR_DEPARTMENT_OTHER)
Admission: EM | Admit: 2015-03-03 | Discharge: 2015-03-03 | Disposition: A | Discharge status: Home / Self Care | Payer: Self-pay | Attending: Emergency Medicine | Admitting: Emergency Medicine

## 2015-03-03 DIAGNOSIS — Z3202 Encounter for pregnancy test, result negative: Secondary | ICD-10-CM | POA: Insufficient documentation

## 2015-03-03 DIAGNOSIS — Z87891 Personal history of nicotine dependence: Secondary | ICD-10-CM | POA: Insufficient documentation

## 2015-03-03 DIAGNOSIS — N946 Dysmenorrhea, unspecified: Secondary | ICD-10-CM | POA: Insufficient documentation

## 2015-03-03 LAB — URINALYSIS, ROUTINE W REFLEX MICROSCOPIC
Bilirubin Urine: NEGATIVE
Glucose, UA: NEGATIVE mg/dL
KETONES UR: 15 mg/dL — AB
Nitrite: NEGATIVE
PROTEIN: NEGATIVE mg/dL
Specific Gravity, Urine: 1.03 (ref 1.005–1.030)
UROBILINOGEN UA: 0.2 mg/dL (ref 0.0–1.0)
pH: 5.5 (ref 5.0–8.0)

## 2015-03-03 LAB — URINE MICROSCOPIC-ADD ON

## 2015-03-03 LAB — COMPREHENSIVE METABOLIC PANEL
ALBUMIN: 5 g/dL (ref 3.5–5.2)
ALT: 13 U/L (ref 0–35)
AST: 24 U/L (ref 0–37)
Alkaline Phosphatase: 72 U/L (ref 39–117)
Anion gap: 12 (ref 5–15)
BUN: 10 mg/dL (ref 6–23)
CO2: 20 mmol/L (ref 19–32)
CREATININE: 0.73 mg/dL (ref 0.50–1.10)
Calcium: 9.7 mg/dL (ref 8.4–10.5)
Chloride: 109 mmol/L (ref 96–112)
GFR calc Af Amer: >90 mL/min (ref >90)
GFR calc non Af Amer: >90 mL/min (ref >90)
Glucose, Bld: 70 mg/dL (ref 70–99)
POTASSIUM: 3.4 mmol/L — AB (ref 3.5–5.1)
Sodium: 141 mmol/L (ref 135–145)
TOTAL PROTEIN: 8.8 g/dL — AB (ref 6.0–8.3)
Total Bilirubin: 1.6 mg/dL — ABNORMAL HIGH (ref 0.3–1.2)

## 2015-03-03 LAB — CBC WITH DIFFERENTIAL/PLATELET
BASOS PCT: 0 % (ref 0–1)
Basophils Absolute: 0 10*3/uL (ref 0.0–0.1)
Eosinophils Absolute: 0.1 10*3/uL (ref 0.0–0.7)
Eosinophils Relative: 1 % (ref 0–5)
HCT: 45.1 % (ref 36.0–46.0)
HEMOGLOBIN: 14.8 g/dL (ref 12.0–15.0)
Lymphocytes Relative: 31 % (ref 12–46)
Lymphs Abs: 2.7 10*3/uL (ref 0.7–4.0)
MCH: 26.9 pg (ref 26.0–34.0)
MCHC: 32.8 g/dL (ref 30.0–36.0)
MCV: 81.9 fL (ref 78.0–100.0)
MONO ABS: 0.7 10*3/uL (ref 0.1–1.0)
MONOS PCT: 8 % (ref 3–12)
NEUTROS ABS: 5.2 10*3/uL (ref 1.7–7.7)
Neutrophils Relative %: 60 % (ref 43–77)
Platelets: 287 10*3/uL (ref 150–400)
RBC: 5.51 MIL/uL — ABNORMAL HIGH (ref 3.87–5.11)
RDW: 15.1 % (ref 11.5–15.5)
WBC: 8.6 10*3/uL (ref 4.0–10.5)

## 2015-03-03 LAB — LIPASE, BLOOD: Lipase: 28 U/L (ref 11–59)

## 2015-03-03 LAB — PREGNANCY, URINE: Preg Test, Ur: NEGATIVE

## 2015-03-03 MED ORDER — ONDANSETRON 8 MG PO TBDP
8.0000 mg | ORAL_TABLET | Freq: Once | ORAL | Status: AC
  Administered 2015-03-03: 8 mg via ORAL
  Filled 2015-03-03: qty 1

## 2015-03-03 NOTE — ED Provider Notes (Signed)
CSN: [id number]     Arrival date & time 03/03/15  1428 History   First MD Initiated Contact with Patient 03/03/15 1805     Chief Complaint  Patient presents with  . Abdominal Pain     (Consider location/radiation/quality/duration/timing/severity/associated sxs/prior Treatment) HPI Jennifer Riddle is a 29 y.o. female with a history of fibroids comes in for evaluation of lower abdominal cramping. Patient states she is experiencing pelvic cramping similar to previous menstrual related cramping. She reports her last period was February 4 and she is usually every 28 days but has not experienced her period this time. She reports these cramps feel like her period is about to come on "but it just doesn't". Nothing makes this problem better or worse. She denies any abdominal pain, vaginal discharge, nausea or vomiting, chest pain or shortness of breath, dizziness, headache, fevers, diarrhea or constipation. No other aggravating or modifying factors. Patient does not have primary care.  Past Medical History  Diagnosis Date  . Fibroid tumor    Past Surgical History  Procedure Laterality Date  . Knee surgery     No family history on file. History  Substance Use Topics  . Smoking status: Former Research scientist (life sciences)  . Smokeless tobacco: Never Used  . Alcohol Use: No   OB History    No data available     Review of Systems A 10 point review of systems was completed and was negative except for pertinent positives and negatives as mentioned in the history of present illness     Allergies  Sulfa antibiotics  Home Medications   Prior to Admission medications   Not on File   BP 114/65 mmHg  Pulse 90  Temp(Src) 97.9 F (36.6 C) (Oral)  Resp 18  Ht 5\' 5"  (1.651 m)  Wt 105 lb (47.628 kg)  BMI 17.47 kg/m2  SpO2 100%  LMP 01/29/2015 Physical Exam  Constitutional: She is oriented to person, place, and time. She appears well-developed and well-nourished.  HENT:  Head: Normocephalic and atraumatic.   Mouth/Throat: Oropharynx is clear and moist.  Eyes: Conjunctivae are normal. Pupils are equal, round, and reactive to light. Right eye exhibits no discharge. Left eye exhibits no discharge. No scleral icterus.  Neck: Neck supple.  Cardiovascular: Normal rate, regular rhythm and normal heart sounds.   Pulmonary/Chest: Effort normal and breath sounds normal. No respiratory distress. She has no wheezes. She has no rales.  Abdominal: Soft. She exhibits no distension and no mass. There is no tenderness. There is no rebound and no guarding.  Musculoskeletal: She exhibits no tenderness.  Neurological: She is alert and oriented to person, place, and time.  Cranial Nerves II-XII grossly intact  Skin: Skin is warm and dry. No rash noted.  Psychiatric: She has a normal mood and affect.  Nursing note and vitals reviewed.   ED Course  Procedures (including critical care time) Labs Review Labs Reviewed  URINALYSIS, ROUTINE W REFLEX MICROSCOPIC - Abnormal; Notable for the following:    APPearance CLOUDY (*)    Hgb urine dipstick TRACE (*)    Ketones, ur 15 (*)    Leukocytes, UA SMALL (*)    All other components within normal limits  URINE MICROSCOPIC-ADD ON - Abnormal; Notable for the following:    Squamous Epithelial / LPF MANY (*)    Bacteria, UA MANY (*)    All other components within normal limits  CBC WITH DIFFERENTIAL/PLATELET - Abnormal; Notable for the following:    RBC 5.51 (*)  All other components within normal limits  COMPREHENSIVE METABOLIC PANEL - Abnormal; Notable for the following:    Potassium 3.4 (*)    Total Protein 8.8 (*)    Total Bilirubin 1.6 (*)    All other components within normal limits  PREGNANCY, URINE  LIPASE, BLOOD    Imaging Review No results found.   EKG Interpretation None      Meds given in ED:  Medications  ondansetron (ZOFRAN-ODT) disintegrating tablet 8 mg (8 mg Oral Given 03/03/15 1818)    There are no discharge medications for this  patient.  Filed Vitals:   03/03/15 1436 03/03/15 1819 03/03/15 1908  BP: 129/80 121/71 114/65  Pulse: 76 70 90  Temp: 97.9 F (36.6 C)    TempSrc: Oral    Resp: 18 18 18   Height: 5\' 5"  (1.651 m)    Weight: 105 lb (47.628 kg)    SpO2: 100% 100% 100%     MDM  Vitals stable, afebrile Feels better with analgesia administered in ED. Tolerating PO in ED Abdominal exam normal, grossly benign physical exam. No Organomegaly.  Labs:  Pregnancy negative, otherwise noncontributory. No evidence of UTI, suspect dirty catch.  Patient likely experiencing symptoms related to dysmenorrhea. Provided resource guide the patient may establish care and follow-up with primary care.  No evidence of Acute Appendicitis, Cholecystitis, Pancreatitis, Pyelonephritis, Cystitis, Diverticulitis, Acute infectious Colitis, SBO. No evidence of GI bleed. Doubt ectopic, torsion. Low suspicion for ischemia or other vascular compromise. Doubt Cardiovascular/Pulmonary etiology  I have personally reviewed all labs, imaging, nursing/previous notes during the patient's evaluation in the ED today. No evidence of other acute or emergent pathology that requires immediate intervention at this time. Pt stable, in good condition and is appropriate for discharge. DC with resource guide and instructions to follow up with PCP within 48 hrs for further evaluation and management of symptoms as well as return precautions.   Final diagnoses:  Dysmenorrhea       Comer Locket, PA-C 03/03/15 [place], MD 03/03/15 [phone]

## 2015-03-03 NOTE — Discharge Instructions (Signed)
Dysmenorrhea °Menstrual cramps (dysmenorrhea) are caused by the muscles of the uterus tightening (contracting) during a menstrual period. For some women, this discomfort is merely bothersome. For others, dysmenorrhea can be severe enough to interfere with everyday activities for a few days each month. °Primary dysmenorrhea is menstrual cramps that last a couple of days when you start having menstrual periods or soon after. This often begins after a teenager starts having her period. As a woman gets older or has a baby, the cramps will usually lessen or disappear. Secondary dysmenorrhea begins later in life, lasts longer, and the pain may be stronger than primary dysmenorrhea. The pain may start before the period and last a few days after the period.  °CAUSES  °Dysmenorrhea is usually caused by an underlying problem, such as: °· The tissue lining the uterus grows outside of the uterus in other areas of the body (endometriosis). °· The endometrial tissue, which normally lines the uterus, is found in or grows into the muscular walls of the uterus (adenomyosis). °· The pelvic blood vessels are engorged with blood just before the menstrual period (pelvic congestive syndrome). °· Overgrowth of cells (polyps) in the lining of the uterus or cervix. °· Falling down of the uterus (prolapse) because of loose or stretched ligaments. °· Depression. °· Bladder problems, infection, or inflammation. °· Problems with the intestine, a tumor, or irritable bowel syndrome. °· Cancer of the female organs or bladder. °· A severely tipped uterus. °· A very tight opening or closed cervix. °· Noncancerous tumors of the uterus (fibroids). °· Pelvic inflammatory disease (PID). °· Pelvic scarring (adhesions) from a previous surgery. °· Ovarian cyst. °· An intrauterine device (IUD) used for birth control. °RISK FACTORS °You may be at greater risk of dysmenorrhea if: °· You are younger than age 30. °· You started puberty early. °· You have  irregular or heavy bleeding. °· You have never given birth. °· You have a family history of this problem. °· You are a smoker. °SIGNS AND SYMPTOMS  °· Cramping or throbbing pain in your lower abdomen. °· Headaches. °· Lower back pain. °· Nausea or vomiting. °· Diarrhea. °· Sweating or dizziness. °· Loose stools. °DIAGNOSIS  °A diagnosis is based on your history, symptoms, physical exam, diagnostic tests, or procedures. Diagnostic tests or procedures may include: °· Blood tests. °· Ultrasonography. °· An examination of the lining of the uterus (dilation and curettage, D&C). °· An examination inside your abdomen or pelvis with a scope (laparoscopy). °· X-rays. °· CT scan. °· MRI. °· An examination inside the bladder with a scope (cystoscopy). °· An examination inside the intestine or stomach with a scope (colonoscopy, gastroscopy). °TREATMENT  °Treatment depends on the cause of the dysmenorrhea. Treatment may include: °· Pain medicine prescribed by your health care provider. °· Birth control pills or an IUD with progesterone hormone in it. °· Hormone replacement therapy. °· Nonsteroidal anti-inflammatory drugs (NSAIDs). These may help stop the production of prostaglandins. °· Surgery to remove adhesions, endometriosis, ovarian cyst, or fibroids. °· Removal of the uterus (hysterectomy). °· Progesterone shots to stop the menstrual period. °· Cutting the nerves on the sacrum that go to the female organs (presacral neurectomy). °· Electric current to the sacral nerves (sacral nerve stimulation). °· Antidepressant medicine. °· Psychiatric therapy, counseling, or group therapy. °· Exercise and physical therapy. °· Meditation and yoga therapy. °· Acupuncture. °HOME CARE INSTRUCTIONS  °· Only take over-the-counter or prescription medicines as directed by your health care provider. °· Place a heating pad   or hot water bottle on your lower back or abdomen. Do not sleep with the heating pad.  Use aerobic exercises, walking,  swimming, biking, and other exercises to help lessen the cramping.  Massage to the lower back or abdomen may help.  Stop smoking.  Avoid alcohol and caffeine. SEEK MEDICAL CARE IF:   Your pain does not get better with medicine.  You have pain with sexual intercourse.  Your pain increases and is not controlled with medicines.  You have abnormal vaginal bleeding with your period.  You develop nausea or vomiting with your period that is not controlled with medicine. SEEK IMMEDIATE MEDICAL CARE IF:  You pass out.  Document Released: 11/13/2005 Document Revised: 07/16/2013 Document Reviewed: 05/01/2013 [facility] Patient Information 2015 [place], [place]. This information is not intended to replace advice given to you by your health care provider. Make sure you discuss any questions you have with your health care provider.   Emergency Department Resource Guide 1) Find a Doctor and Pay Out of Pocket Although you won't have to find out who is covered by your insurance plan, it is a good idea to ask around and get recommendations. You will then need to call the office and see if the doctor you have chosen will accept you as a new patient and what types of options they offer for patients who are self-pay. Some doctors offer discounts or will set up payment plans for their patients who do not have insurance, but you will need to ask so you aren't surprised when you get to your appointment.  2) Contact Your Local Health Department Not all health departments have doctors that can see patients for sick visits, but many do, so it is worth a call to see if yours does. If you don't know where your local health department is, you can check in your phone book. The CDC also has a tool to help you locate your state's health department, and many state websites also have listings of all of their local health departments.  3) Find a Finger Clinic If your illness is not likely to be very severe or complicated,  you may want to try a walk in clinic. These are popping up all over the country in pharmacies, drugstores, and shopping centers. They're usually staffed by nurse practitioners or physician assistants that have been trained to treat common illnesses and complaints. They're usually fairly quick and inexpensive. However, if you have serious medical issues or chronic medical problems, these are probably not your best option.  No Primary Care Doctor: - Call Health Connect at  [phone] - they can help you locate a primary care doctor that  accepts your insurance, provides certain services, etc. - Physician Referral Service- [phone]  Chronic Pain Problems: Organization         Address  Phone   Notes  [facility]  [phone] Patients need to be referred by their primary care doctor.   Medication Assistance: Organization         Address  Phone   Notes  [facility] Medication [address] [place], [place] [address] [phone] --Must be a resident of [facility] -- Must have NO insurance coverage whatsoever (no Medicaid/ Medicare, etc.) -- The pt. MUST have a primary care doctor that directs their care regularly and follows them in the community   [facility]ist  [phone]   [employer]  [phone]    Agencies that provide  inexpensive medical care: Organization         Address  Phone   Notes  [place]  [phone]   [facility]    [phone]   [facility] [place], [place] [address] [phone]   [place] [address] [place] [phone]   Planned Parenthood    [phone]   [facility]    [phone]   [place] and [place] [address], [place] Phone:  [phone], Fax:  [phone] Hours of Operation:  9 am - 6 pm, M-F.  Also accepts Medicaid/Medicare and  self-pay.  [facility] [place], [address], [place] Phone: [phone], Fax: [phone]. Hours of Operation:  8:30 am - 5:30 pm, M-F.  Also accepts Medicaid and self-pay.  [facility] [address], [place] Phone: [phone]   [place], [place], [place] [phone], Ext. [phone] Mondays & Thursdays: 7-9 AM.  First 15 patients are seen on a first come, first serve basis.    [place] Providers:  Organization         Address  Phone   Notes  [facility] [address], [place] [phone] Also accepts self-pay patients.  [facility] [address], [place]  [phone]   [place], [address], [place] [phone]   [facility] [address], [place] [phone]   Lucianne Lei [address], [place]   [phone] Only accepts [place] Access [place] patients after they have their name applied to their card.   Self-Pay (no insurance) in [facility]:  Organization         Address  Phone   Notes  Sickle Cell Patients, [facility] [place] [phone]   [facility] [place] [phone]   [facility] [place]  [place], [place], [place] [phone]   [facility]/Dr. Osei-Bonsu  [address] [place] or [address], [place] [phone] Phone number for both [place] and [place] locations is the same.  Urgent Medical and [facility] [address], [place] [phone]   [facility] [address], [place] or [address] [phone]   [facility] [address] [place] [phone], phone;  [phone], fax Sees patients 1st and 3rd Saturday of every month.  Must not qualify for public or private insurance (i.e. Medicaid, Medicare, [place] Health Choice, Veterans' Benefits)  Household income should be no more than 200% of the poverty level The clinic cannot treat you if you are pregnant or think you are pregnant  Sexually transmitted diseases are not treated at the clinic.    Dental Care: Organization         Address  Phone  Notes  [facility] Department of [facility] [place] [phone] Accepts children up to age 72 who are enrolled in [place] or [place]; pregnant women with a Medicaid card; and children who have applied for Medicaid or [place] Health Choice,  but were declined, whose parents can pay a reduced fee at time of service.  [facility] Department of [facility]  [address], [place] [phone] Accepts children up to age 75 who are enrolled in [place] or [place]; pregnant women with a Medicaid card; and children who have applied for Medicaid or [place] Health Choice, but were declined, whose parents can pay a reduced fee at time of service.  [place] Adult [facility] Access PROGRAM  [place] [phone] Patients are seen by appointment only. Walk-ins are not accepted. [place] will see patients 42 years of age and older. Monday - Tuesday (8am-5pm) Most Wednesdays (8:30-5pm) $30 per visit, cash only  [facility] Adult Dental Access PROGRAM  [address], [facility] [phone] Patients are seen by appointment only. Walk-ins are not accepted. [place] will see patients 10 years of age and older. One Wednesday Evening (Monthly: Volunteer Based).  $30 per visit, cash only  [place]  [phone] for adults; Children under age 80, call Graduate Pediatric Dentistry at [phone]. Children aged 53-14, please call  [phone] to request a pediatric application.  Dental services are provided in all areas of dental care including fillings, crowns and bridges, complete and partial dentures, implants, gum treatment, root canals, and extractions. Preventive care is also provided. Treatment is provided to both adults and children. Patients are selected via a lottery and there is often a waiting list.   [facility] [address] [place]  [phone] [email or web address]   [facility] [address] [place], [place] [phone], Ext. [phone] Second and Fourth Thursday of each month, opens at 6:30 AM; Clinic ends at 9 AM.  Patients are seen on a first-come first-served basis, and a limited number are seen during each clinic.   [facility]  [address] Hillard Danker [place], [place] [phone]   Eligibility Requirements You must have lived in [place], [place], or [place] counties for at least the last three months.   You cannot be eligible for state or federal sponsored [employer], including [employer], [place], or [employer].   You generally cannot be eligible for healthcare insurance through your employer.    How to apply: Eligibility screenings are held every Tuesday and Wednesday afternoon from 1:00 pm until 4:00 pm. You do not need an appointment for the interview!  [facility] [address] [place], [place]   [place]  [place] Department  [place]  [phone]    Behavioral Health Resources in the Community: Intensive Outpatient Programs Organization         Address  Phone  Notes  [place]. [address], [place], [place] [phone]   [facility] [address] [place], [place]   ADS: Alcohol [facility] Drug [facility] [address], [place], [place]   [place] [address],  [place], [place] or [phone]   Substance Abuse Resources Organization         Address  Phone  Notes  Alcohol and Drug Services  [phone]   [facility]  [phone]   [facility] [place]  [phone]   Chinita Pester  [phone]   Residential & Outpatient Substance Abuse Program  [phone]   Psychological  Special educational needs teacher         Address  Phone  Notes  [facility]  [place]  [phone]   [place] [address] [place] or [phone]    Mobile Crisis Teams Organization         Address  Phone  Notes  [facility], Mobile Crisis Care Unit  [phone]   [facility]  [address] [place], [place]   [place] Levels [address], [place] [phone]    Self-Help/Support Groups Organization         Address  Phone             Notes  [place]. of [place] - variety of support groups  Frank Call for more information  Narcotics Anonymous (NA), [facility] [address], [employer] [place]  2 meetings at this location   Special educational needs teacher         Address  Phone  Notes  ASAP Residential Treatment [place],    [place]  [phone]   [facility]  [address] [place] [address], [place], [place]   [place], [place] [phone] Admissions: 8am-3pm M-F  Incentives Substance [place] [address],    [place], [place] [phone]   [facility] [address] [place], [place], [place]   [facility] [address],  [place], [place]   [facility] - Intensive Outpatient [address], [place], [place]   [facility] [facility] [place].,  [place], [place] [phone] or [phone]   [facility] Services (RTS) [address], [place], [place] Accepts Medicaid  [facility] [place] [address],  [place] [phone] Substance Abuse/Addiction Treatment   [facility] Organization         Address  Phone  Notes  [facility]  [phone]   Domenic Schwab, PhD [address] Arlis [place], [place]   [phone] or [phone]   [place], [place] [phone]   [facility] [address] [place], [place] [phone] Insurance/Medicaid/sponsorship through [facility] and Families [address], [address] [place]                                    [place], [place] [phone] [place] [address], [place] [phone]    Dr. Adele Schilder  [phone]   [facility] Dept. 1) [address], [place] 2) [place] 3)  [place] [address], [place] [phone]  [phone]   [place] [phone] or [phone] (After Hours)

## 2015-03-03 NOTE — ED Notes (Signed)
Abd pain x 2 weeks with nausea and diarrhea

## 2015-05-25 ENCOUNTER — Encounter (HOSPITAL_BASED_OUTPATIENT_CLINIC_OR_DEPARTMENT_OTHER): Payer: Self-pay | Admitting: Emergency Medicine

## 2015-05-25 ENCOUNTER — Emergency Department (HOSPITAL_BASED_OUTPATIENT_CLINIC_OR_DEPARTMENT_OTHER)
Admission: EM | Admit: 2015-05-25 | Discharge: 2015-05-25 | Disposition: A | Discharge status: Home / Self Care | Payer: Self-pay | Attending: Emergency Medicine | Admitting: Emergency Medicine

## 2015-05-25 DIAGNOSIS — Z87891 Personal history of nicotine dependence: Secondary | ICD-10-CM | POA: Insufficient documentation

## 2015-05-25 DIAGNOSIS — N390 Urinary tract infection, site not specified: Secondary | ICD-10-CM | POA: Insufficient documentation

## 2015-05-25 DIAGNOSIS — Z86018 Personal history of other benign neoplasm: Secondary | ICD-10-CM | POA: Insufficient documentation

## 2015-05-25 DIAGNOSIS — Z3202 Encounter for pregnancy test, result negative: Secondary | ICD-10-CM | POA: Insufficient documentation

## 2015-05-25 DIAGNOSIS — B9689 Other specified bacterial agents as the cause of diseases classified elsewhere: Secondary | ICD-10-CM

## 2015-05-25 DIAGNOSIS — N760 Acute vaginitis: Secondary | ICD-10-CM | POA: Insufficient documentation

## 2015-05-25 LAB — WET PREP, GENITAL
TRICH WET PREP: NONE SEEN
Yeast Wet Prep HPF POC: NONE SEEN

## 2015-05-25 LAB — URINALYSIS, ROUTINE W REFLEX MICROSCOPIC
Bilirubin Urine: NEGATIVE
Glucose, UA: NEGATIVE mg/dL
HGB URINE DIPSTICK: NEGATIVE
KETONES UR: NEGATIVE mg/dL
Nitrite: NEGATIVE
Protein, ur: NEGATIVE mg/dL
Specific Gravity, Urine: 1.024 (ref 1.005–1.030)
Urobilinogen, UA: 0.2 mg/dL (ref 0.0–1.0)
pH: 6 (ref 5.0–8.0)

## 2015-05-25 LAB — URINE MICROSCOPIC-ADD ON

## 2015-05-25 LAB — PREGNANCY, URINE: Preg Test, Ur: NEGATIVE

## 2015-05-25 MED ORDER — METRONIDAZOLE 500 MG PO TABS: 500.0000 mg | ORAL_TABLET | Freq: Two times a day (BID) | ORAL | Status: DC

## 2015-05-25 MED ORDER — CEPHALEXIN 500 MG PO CAPS: 500.0000 mg | ORAL_CAPSULE | Freq: Two times a day (BID) | ORAL | Status: DC

## 2015-05-25 NOTE — Discharge Instructions (Signed)
Bacterial Vaginosis Bacterial vaginosis is a vaginal infection that occurs when the normal balance of bacteria in the vagina is disrupted. It results from an overgrowth of certain bacteria. This is the most common vaginal infection in women of childbearing age. Treatment is important to prevent complications, especially in pregnant women, as it can cause a premature delivery. CAUSES  Bacterial vaginosis is caused by an increase in harmful bacteria that are normally present in smaller amounts in the vagina. Several different kinds of bacteria can cause bacterial vaginosis. However, the reason that the condition develops is not fully understood. RISK FACTORS Certain activities or behaviors can put you at an increased risk of developing bacterial vaginosis, including:  Having a new sex partner or multiple sex partners.  Douching.  Using an intrauterine device (IUD) for contraception. Women do not get bacterial vaginosis from toilet seats, bedding, swimming pools, or contact with objects around them. SIGNS AND SYMPTOMS  Some women with bacterial vaginosis have no signs or symptoms. Common symptoms include:  Grey vaginal discharge.  A fishlike odor with discharge, especially after sexual intercourse.  Itching or burning of the vagina and vulva.  Burning or pain with urination. DIAGNOSIS  Your health care provider will take a medical history and examine the vagina for signs of bacterial vaginosis. A sample of vaginal fluid may be taken. Your health care provider will look at this sample under a microscope to check for bacteria and abnormal cells. A vaginal pH test may also be done.  TREATMENT  Bacterial vaginosis may be treated with antibiotic medicines. These may be given in the form of a pill or a vaginal cream. A second round of antibiotics may be prescribed if the condition comes back after treatment.  HOME CARE INSTRUCTIONS   Only take over-the-counter or prescription medicines as  directed by your health care provider.  If antibiotic medicine was prescribed, take it as directed. Make sure you finish it even if you start to feel better.  Do not have sex until treatment is completed.  Tell all sexual partners that you have a vaginal infection. They should see their health care provider and be treated if they have problems, such as a mild rash or itching.  Practice safe sex by using condoms and only having one sex partner. SEEK MEDICAL CARE IF:   Your symptoms are not improving after 3 days of treatment.  You have increased discharge or pain.  You have a fever. MAKE SURE YOU:   Understand these instructions.  Will watch your condition.  Will get help right away if you are not doing well or get worse. FOR MORE INFORMATION  Centers for Disease Control and Prevention, Division of STD Prevention: [employer]raud[employer] American Sexual Health Association (ASHA): www.ashastd.org  Document Released: 11/13/2005 Document Revised: 09/03/2013 Document Reviewed: 06/25/2013 [facility] Patient Information 2015 [place], [place]. This information is not intended to replace advice given to you by your health care provider. Make sure you discuss any questions you have with your health care provider.  Urinary Tract Infection A urinary tract infection (UTI) can occur any place along the urinary tract. The tract includes the kidneys, ureters, bladder, and urethra. A type of germ called bacteria often causes a UTI. UTIs are often helped with antibiotic medicine.  HOME CARE   If given, take antibiotics as told by your doctor. Finish them even if you start to feel better.  Drink enough fluids to keep your pee (urine) clear or pale yellow.  Avoid tea, drinks with caffeine,  and bubbly (carbonated) drinks.  Pee often. Avoid holding your pee in for a long time.  Pee before and after having sex (intercourse).  Wipe from front to back after you poop (bowel movement) if you are a woman. Use  each tissue only once. GET HELP RIGHT AWAY IF:   You have back pain.  You have lower belly (abdominal) pain.  You have chills.  You feel sick to your stomach (nauseous).  You throw up (vomit).  Your burning or discomfort with peeing does not go away.  You have a fever.  Your symptoms are not better in 3 days. MAKE SURE YOU:   Understand these instructions.  Will watch your condition.  Will get help right away if you are not doing well or get worse. Document Released: 05/01/2008 Document Revised: 08/07/2012 Document Reviewed: 06/13/2012 [facility] Patient Information 2015 [place], [place]. This information is not intended to replace advice given to you by your health care provider. Make sure you discuss any questions you have with your health care provider.

## 2015-05-25 NOTE — ED Notes (Signed)
29 yo with c/o uti sypmtoms x2 days with discharge.

## 2015-05-25 NOTE — ED Provider Notes (Signed)
CSN: [id number]     Arrival date & time 05/25/15  1937 History   First MD Initiated Contact with Patient 05/25/15 2010     Chief Complaint  Patient presents with  . Urinary Tract Infection     (Consider location/radiation/quality/duration/timing/severity/associated sxs/prior Treatment) HPI Comments: Pt comes in with c/o urinary frequency and urgency. No abdominal pain,fever,n/v/d. She has had some back pain and clear and white vaginal discharge. Does have a history of sti earlier this year.   The history is provided by the patient. No language interpreter was used.    Past Medical History  Diagnosis Date  . Fibroid tumor    Past Surgical History  Procedure Laterality Date  . Knee surgery     No family history on file. History  Substance Use Topics  . Smoking status: Former Research scientist (life sciences)  . Smokeless tobacco: Never Used  . Alcohol Use: No   OB History    No data available     Review of Systems  All other systems reviewed and are negative.     Allergies  Sulfa antibiotics  Home Medications   Prior to Admission medications   Not on File   BP 131/75 mmHg  Pulse 72  Temp(Src) 98.2 F (36.8 C) (Oral)  Resp 18  Ht 5\' 5"  (1.651 m)  Wt 105 lb (47.628 kg)  BMI 17.47 kg/m2  SpO2 100%  LMP 05/14/2015 Physical Exam  Constitutional: She is oriented to person, place, and time. She appears well-developed and well-nourished.  Cardiovascular: Normal rate and regular rhythm.   Pulmonary/Chest: Effort normal and breath sounds normal.  Abdominal: Soft. Bowel sounds are normal. There is no tenderness. There is no CVA tenderness.  Musculoskeletal: Normal range of motion.  Neurological: She is alert and oriented to person, place, and time.  Skin: Skin is warm and dry.  Psychiatric: She has a normal mood and affect.  Nursing note and vitals reviewed.   ED Course  Procedures (including critical care time) Labs Review Labs Reviewed  WET PREP, GENITAL - Abnormal; Notable for  the following:    Clue Cells Wet Prep HPF POC MODERATE (*)    WBC, Wet Prep HPF POC MANY (*)    All other components within normal limits  URINALYSIS, ROUTINE W REFLEX MICROSCOPIC (NOT AT [facility]) - Abnormal; Notable for the following:    APPearance CLOUDY (*)    Leukocytes, UA MODERATE (*)    All other components within normal limits  URINE MICROSCOPIC-ADD ON - Abnormal; Notable for the following:    Squamous Epithelial / LPF FEW (*)    Bacteria, UA FEW (*)    All other components within normal limits  PREGNANCY, URINE  HIV ANTIBODY (ROUTINE TESTING)  GC/CHLAMYDIA PROBE AMP ([place]) NOT AT [facility]    Imaging Review No results found.   EKG Interpretation None      MDM   Final diagnoses:  UTI (lower urinary tract infection)  BV (bacterial vaginosis)    Will treat for uti and bv. Urine cultures sent. Pt is not pregnant.    Glendell Docker, NP 05/25/15 2158  Serita Grit, MD 05/25/15 2329

## 2015-05-25 NOTE — ED Notes (Signed)
Pt c/o lower abd pain, back pain w vag dc x 3 days  Denies burning w urination of urinary freq

## 2015-05-27 LAB — GC/CHLAMYDIA PROBE AMP (~~LOC~~) NOT AT ARMC
CHLAMYDIA, DNA PROBE: NEGATIVE
NEISSERIA GONORRHEA: NEGATIVE

## 2015-05-27 LAB — HIV ANTIBODY (ROUTINE TESTING W REFLEX): HIV Screen 4th Generation wRfx: NONREACTIVE

## 2015-10-23 ENCOUNTER — Emergency Department (HOSPITAL_BASED_OUTPATIENT_CLINIC_OR_DEPARTMENT_OTHER)
Admission: EM | Admit: 2015-10-23 | Discharge: 2015-10-23 | Disposition: A | Discharge status: Home / Self Care | Payer: Self-pay | Attending: Emergency Medicine | Admitting: Emergency Medicine

## 2015-10-23 ENCOUNTER — Encounter (HOSPITAL_BASED_OUTPATIENT_CLINIC_OR_DEPARTMENT_OTHER): Payer: Self-pay | Admitting: Emergency Medicine

## 2015-10-23 DIAGNOSIS — N760 Acute vaginitis: Secondary | ICD-10-CM | POA: Insufficient documentation

## 2015-10-23 DIAGNOSIS — B9689 Other specified bacterial agents as the cause of diseases classified elsewhere: Secondary | ICD-10-CM

## 2015-10-23 DIAGNOSIS — B373 Candidiasis of vulva and vagina: Secondary | ICD-10-CM | POA: Insufficient documentation

## 2015-10-23 DIAGNOSIS — Z87891 Personal history of nicotine dependence: Secondary | ICD-10-CM | POA: Insufficient documentation

## 2015-10-23 DIAGNOSIS — B3731 Acute candidiasis of vulva and vagina: Secondary | ICD-10-CM

## 2015-10-23 DIAGNOSIS — Z3202 Encounter for pregnancy test, result negative: Secondary | ICD-10-CM | POA: Insufficient documentation

## 2015-10-23 DIAGNOSIS — Z86018 Personal history of other benign neoplasm: Secondary | ICD-10-CM | POA: Insufficient documentation

## 2015-10-23 LAB — URINALYSIS, ROUTINE W REFLEX MICROSCOPIC
Bilirubin Urine: NEGATIVE
GLUCOSE, UA: NEGATIVE mg/dL
KETONES UR: 40 mg/dL — AB
Nitrite: NEGATIVE
PROTEIN: NEGATIVE mg/dL
Specific Gravity, Urine: 1.023 (ref 1.005–1.030)
pH: 5.5 (ref 5.0–8.0)

## 2015-10-23 LAB — WET PREP, GENITAL
Sperm: NONE SEEN
Trich, Wet Prep: NONE SEEN

## 2015-10-23 LAB — URINE MICROSCOPIC-ADD ON

## 2015-10-23 LAB — PREGNANCY, URINE: Preg Test, Ur: NEGATIVE

## 2015-10-23 MED ORDER — METRONIDAZOLE 500 MG PO TABS: 500.0000 mg | ORAL_TABLET | Freq: Two times a day (BID) | ORAL | Status: DC

## 2015-10-23 MED ORDER — FLUCONAZOLE 100 MG PO TABS
150.0000 mg | ORAL_TABLET | Freq: Once | ORAL | Status: AC
  Administered 2015-10-23: 150 mg via ORAL
  Filled 2015-10-23: qty 1

## 2015-10-23 NOTE — ED Notes (Signed)
Pt presents today with cont c/o having vaginal discharge 3 days post monthly cycle. Denies any burning or itching at vaginal area, no rashes per pt statement

## 2015-10-23 NOTE — Discharge Instructions (Signed)

## 2015-10-23 NOTE — ED Notes (Signed)
Pt reports increased vaginal discharge since Wednesday and would like to be checked

## 2015-10-23 NOTE — ED Provider Notes (Signed)
CSN: [id number]     Arrival date & time 10/23/15  1452 History  By signing my name below, I, Helane Gunther, attest that this documentation has been prepared under the direction and in the presence of Debby Freiberg, MD. Electronically Signed: Helane Gunther, ED Scribe. 10/23/2015. 4:14 PM.    Chief Complaint  Patient presents with  . Vaginal Discharge   Patient is a 29 y.o. female presenting with vaginal discharge. The history is provided by the patient. No language interpreter was used.  Vaginal Discharge Quality:  Malodorous Severity:  Moderate Onset quality:  Gradual Duration:  3 days Timing:  Constant Progression:  Unchanged Chronicity:  New Relieved by:  None tried Worsened by:  Nothing tried Ineffective treatments:  None tried Associated symptoms: no abdominal pain, no dysuria, no fever, no nausea and no vomiting    HPI Comments: Jennifer Riddle is a 29 y.o. female former smoker with a PMHx of fibroid tumor who presents to the Emergency Department complaining of malodorous vaginal discharge onset 3 days ago. She reports just having finished her period 3 days ago, after which the vaginal discharge continued. Pt denies n/v/d, fever, dysuria, and abdominal pain.   Past Medical History  Diagnosis Date  . Fibroid tumor    Past Surgical History  Procedure Laterality Date  . Knee surgery     History reviewed. No pertinent family history. Social History  Substance Use Topics  . Smoking status: Former Research scientist (life sciences)  . Smokeless tobacco: Never Used  . Alcohol Use: No   OB History    No data available     Review of Systems  Constitutional: Negative for fever.  Gastrointestinal: Negative for nausea, vomiting, abdominal pain and diarrhea.  Genitourinary: Positive for vaginal discharge. Negative for dysuria.  All other systems reviewed and are negative.   Allergies  Sulfa antibiotics  Home Medications   Prior to Admission medications   Medication Sig Start Date End Date  Taking? Authorizing Provider  metroNIDAZOLE (FLAGYL) 500 MG tablet Take 1 tablet (500 mg total) by mouth 2 (two) times daily. One po bid x 7 days 10/23/15   Debby Freiberg, MD   BP 129/93 mmHg  Pulse 90  Temp(Src) 98 F (36.7 C)  Resp 20  Ht 5\' 5"  (1.651 m)  Wt 105 lb (47.628 kg)  BMI 17.47 kg/m2  LMP 10/13/2015 Physical Exam  Constitutional: She is oriented to person, place, and time. She appears well-developed and well-nourished.  HENT:  Head: Normocephalic and atraumatic.  Right Ear: External ear normal.  Left Ear: External ear normal.  Eyes: Conjunctivae and EOM are normal. Pupils are equal, round, and reactive to light.  Neck: Normal range of motion. Neck supple.  Cardiovascular: Normal rate, regular rhythm, normal heart sounds and intact distal pulses.   Pulmonary/Chest: Effort normal and breath sounds normal.  Abdominal: Soft. Bowel sounds are normal. There is no tenderness.  Genitourinary: Cervix exhibits discharge (white). Cervix exhibits no motion tenderness and no friability. Right adnexum displays no tenderness. Left adnexum displays no tenderness. Vaginal discharge (white) found.  Musculoskeletal: Normal range of motion.  Neurological: She is alert and oriented to person, place, and time.  Skin: Skin is warm and dry.  Vitals reviewed.   ED Course  Procedures   COORDINATION OF CARE: 3:47 PM - Discussed plans to perform a pelvic exam. Pt advised of plan for treatment and pt agrees.  Labs Review Labs Reviewed  WET PREP, GENITAL - Abnormal; Notable for the following:    Yeast  Wet Prep HPF POC PRESENT (*)    Clue Cells Wet Prep HPF POC PRESENT (*)    WBC, Wet Prep HPF POC MANY (*)    All other components within normal limits  URINALYSIS, ROUTINE W REFLEX MICROSCOPIC (NOT AT [facility]) - Abnormal; Notable for the following:    Hgb urine dipstick SMALL (*)    Ketones, ur 40 (*)    Leukocytes, UA MODERATE (*)    All other components within normal limits  URINE  MICROSCOPIC-ADD ON - Abnormal; Notable for the following:    Squamous Epithelial / LPF 0-5 (*)    Bacteria, UA MANY (*)    All other components within normal limits  PREGNANCY, URINE  GC/CHLAMYDIA PROBE AMP ([place]) NOT AT [facility]    Imaging Review No results found. I have personally reviewed and evaluated these images and lab results as part of my medical decision-making.   EKG Interpretation None      MDM   Final diagnoses:  Candidal vulvovaginitis  Bacterial vaginosis    29 y.o. female without pertinent PMH presents with vaginal dc as above.  No abd pain, fevers, or concerning sexual history.  Physical exam as above with mild white dc, otherwise benign exam.  Wu with yeast and BV.  Diflucan given in ED.  Prescription for flagyl given.  DC home in stable condition.    I have reviewed all laboratory and imaging studies if ordered as above  1. Candidal vulvovaginitis   2. Bacterial vaginosis           Debby Freiberg, MD 10/23/15 1739

## 2015-10-23 NOTE — ED Notes (Signed)
Pelvic cart set up in room 

## 2015-10-25 LAB — GC/CHLAMYDIA PROBE AMP (~~LOC~~) NOT AT ARMC
CHLAMYDIA, DNA PROBE: NEGATIVE
Neisseria Gonorrhea: NEGATIVE

## 2016-09-13 ENCOUNTER — Emergency Department (HOSPITAL_BASED_OUTPATIENT_CLINIC_OR_DEPARTMENT_OTHER)
Admission: EM | Admit: 2016-09-13 | Discharge: 2016-09-13 | Disposition: A | Discharge status: Home / Self Care | Payer: Self-pay | Attending: Emergency Medicine | Admitting: Emergency Medicine

## 2016-09-13 ENCOUNTER — Encounter (HOSPITAL_BASED_OUTPATIENT_CLINIC_OR_DEPARTMENT_OTHER): Payer: Self-pay | Admitting: *Deleted

## 2016-09-13 DIAGNOSIS — B9689 Other specified bacterial agents as the cause of diseases classified elsewhere: Secondary | ICD-10-CM

## 2016-09-13 DIAGNOSIS — N760 Acute vaginitis: Secondary | ICD-10-CM | POA: Insufficient documentation

## 2016-09-13 DIAGNOSIS — L299 Pruritus, unspecified: Secondary | ICD-10-CM

## 2016-09-13 DIAGNOSIS — Z87891 Personal history of nicotine dependence: Secondary | ICD-10-CM | POA: Insufficient documentation

## 2016-09-13 LAB — COMPREHENSIVE METABOLIC PANEL
ALBUMIN: 4.1 g/dL (ref 3.5–5.0)
ALK PHOS: 69 U/L (ref 38–126)
ALT: 12 U/L — ABNORMAL LOW (ref 14–54)
ANION GAP: 6 (ref 5–15)
AST: 18 U/L (ref 15–41)
BILIRUBIN TOTAL: 0.8 mg/dL (ref 0.3–1.2)
BUN: 9 mg/dL (ref 6–20)
CALCIUM: 8.9 mg/dL (ref 8.9–10.3)
CO2: 23 mmol/L (ref 22–32)
Chloride: 107 mmol/L (ref 101–111)
Creatinine, Ser: 0.67 mg/dL (ref 0.44–1.00)
GFR calc Af Amer: >60 mL/min (ref >60)
GFR calc non Af Amer: >60 mL/min (ref >60)
GLUCOSE: 88 mg/dL (ref 65–99)
Potassium: 3.3 mmol/L — ABNORMAL LOW (ref 3.5–5.1)
SODIUM: 136 mmol/L (ref 135–145)
TOTAL PROTEIN: 7.4 g/dL (ref 6.5–8.1)

## 2016-09-13 LAB — URINE MICROSCOPIC-ADD ON

## 2016-09-13 LAB — CBC WITH DIFFERENTIAL/PLATELET
BASOS PCT: 0 %
Basophils Absolute: 0 10*3/uL (ref 0.0–0.1)
Eosinophils Absolute: 0.1 10*3/uL (ref 0.0–0.7)
Eosinophils Relative: 1 %
HEMATOCRIT: 31.4 % — AB (ref 36.0–46.0)
HEMOGLOBIN: 10.5 g/dL — AB (ref 12.0–15.0)
LYMPHS ABS: 1.4 10*3/uL (ref 0.7–4.0)
Lymphocytes Relative: 13 %
MCH: 26.3 pg (ref 26.0–34.0)
MCHC: 33.4 g/dL (ref 30.0–36.0)
MCV: 78.5 fL (ref 78.0–100.0)
MONOS PCT: 7 %
Monocytes Absolute: 0.7 10*3/uL (ref 0.1–1.0)
NEUTROS ABS: 8.2 10*3/uL — AB (ref 1.7–7.7)
Neutrophils Relative %: 79 %
Platelets: 273 10*3/uL (ref 150–400)
RBC: 4 MIL/uL (ref 3.87–5.11)
RDW: 14.2 % (ref 11.5–15.5)
WBC: 10.4 10*3/uL (ref 4.0–10.5)

## 2016-09-13 LAB — URINALYSIS, ROUTINE W REFLEX MICROSCOPIC
Bilirubin Urine: NEGATIVE
Glucose, UA: NEGATIVE mg/dL
Nitrite: NEGATIVE
PROTEIN: NEGATIVE mg/dL
Specific Gravity, Urine: 1.03 (ref 1.005–1.030)
pH: 5.5 (ref 5.0–8.0)

## 2016-09-13 LAB — WET PREP, GENITAL
Sperm: NONE SEEN
Trich, Wet Prep: NONE SEEN
Yeast Wet Prep HPF POC: NONE SEEN

## 2016-09-13 LAB — PREGNANCY, URINE: PREG TEST UR: NEGATIVE

## 2016-09-13 MED ORDER — METHYLPREDNISOLONE SODIUM SUCC 125 MG IJ SOLR: 125.0000 mg | Freq: Once | INTRAMUSCULAR | Status: DC

## 2016-09-13 MED ORDER — DIPHENHYDRAMINE HCL 50 MG/ML IJ SOLN
50.0000 mg | Freq: Once | INTRAMUSCULAR | Status: DC
Start: 2016-09-13 — End: 2016-09-13

## 2016-09-13 MED ORDER — MORPHINE SULFATE (PF) 4 MG/ML IV SOLN: 4.0000 mg | Freq: Once | INTRAVENOUS | Status: DC

## 2016-09-13 MED ORDER — METRONIDAZOLE 500 MG PO TABS
500.0000 mg | ORAL_TABLET | Freq: Once | ORAL | Status: AC
  Administered 2016-09-13: 500 mg via ORAL
  Filled 2016-09-13: qty 1

## 2016-09-13 MED ORDER — METRONIDAZOLE 500 MG PO TABS: 500.0000 mg | ORAL_TABLET | Freq: Two times a day (BID) | ORAL | 0 refills | Status: DC

## 2016-09-13 NOTE — Discharge Instructions (Signed)
Do not hesitate to return to the emergency room for any new, worsening or concerning symptoms. ° °Please obtain primary care using resource guide below. Let them know that you were seen in the emergency room and that they will need to obtain records for further outpatient management. ° ° °

## 2016-09-13 NOTE — ED Provider Notes (Signed)
[facility] DEPT [facility] Provider Note   CSN: [id number] Arrival date & time: 09/13/16  1703     History   Chief Complaint Chief Complaint  Patient presents with  . Vaginal Discharge    HPI   Blood pressure 139/99, pulse 99, temperature 97.8 F (36.6 C), temperature source Oral, resp. rate 16, height 5\' 5"  (1.651 m), weight 49.9 kg, last menstrual period 09/06/2016, SpO2 100 %.  Jennifer Riddle is a 30 y.o. female complaining of itching to entire body worsening over the course of last 3 days. Patient states she had some heavy vaginal discharge about a week ago, states that this is typical for premenstrual discharge for her. She started her period 3-4 days ago, she had some itching in the vaginal area but states that it is spread everywhere she denies rash, fever, chills, alcohol use, yellowing of skin, abdominal pain, nausea, vomiting, perennial lesions, urinary frequency, dysuria, foul-smelling or concentrated urine, flank pain, dieting or decreased by mouth intake.  Past Medical History:  Diagnosis Date  . Fibroid tumor     There are no active problems to display for this patient.   Past Surgical History:  Procedure Laterality Date  . KNEE SURGERY      OB History    No data available       Home Medications    Prior to Admission medications   Medication Sig Start Date End Date Taking? Authorizing Provider  metroNIDAZOLE (FLAGYL) 500 MG tablet Take 1 tablet (500 mg total) by mouth 2 (two) times daily. One po bid x 7 days 09/13/16   Monico Blitz, PA-C    Family History History reviewed. No pertinent family history.  Social History Social History  Substance Use Topics  . Smoking status: Former Research scientist (life sciences)  . Smokeless tobacco: Never Used  . Alcohol use No     Allergies   Sulfa antibiotics   Review of Systems Review of Systems  10 systems reviewed and found to be negative, except as noted in the HPI.   Physical Exam Updated Vital Signs BP 103/75  (BP Location: Right Arm)   Pulse 78   Temp 98.3 F (36.8 C) (Oral)   Resp 18   Ht 5\' 5"  (1.651 m)   Wt 49.9 kg   LMP 09/06/2016   SpO2 100%   BMI 18.30 kg/m   Physical Exam  Constitutional: She is oriented to person, place, and time. She appears well-developed and well-nourished. No distress.  HENT:  Head: Normocephalic and atraumatic.  Mouth/Throat: Oropharynx is clear and moist.  Eyes: Conjunctivae and EOM are normal. Pupils are equal, round, and reactive to light. No scleral icterus.  Neck: Normal range of motion.  Cardiovascular: Normal rate, regular rhythm and intact distal pulses.   Pulmonary/Chest: Effort normal and breath sounds normal. No respiratory distress. She has no wheezes. She has no rales. She exhibits no tenderness.  Abdominal: Soft. She exhibits no distension and no mass. There is no tenderness. There is no rebound and no guarding. No hernia.  Genitourinary:  Genitourinary Comments: Pelvic exam with no lesions, dark blood pooled in the posterior fornix, no cervical motion or adnexal tenderness.  Musculoskeletal: Normal range of motion.  Neurological: She is alert and oriented to person, place, and time.  Skin: She is not diaphoretic.  Psychiatric: She has a normal mood and affect.  Nursing note and vitals reviewed.    ED Treatments / Results  Labs (all labs ordered are listed, but only abnormal results are displayed) Labs  Reviewed  WET PREP, GENITAL - Abnormal; Notable for the following:       Result Value   Clue Cells Wet Prep HPF POC FEW (*)    WBC, Wet Prep HPF POC MODERATE (*)    All other components within normal limits  URINALYSIS, ROUTINE W REFLEX MICROSCOPIC (NOT AT [facility]) - Abnormal; Notable for the following:    APPearance CLOUDY (*)    Hgb urine dipstick MODERATE (*)    Ketones, ur >80 (*)    Leukocytes, UA LARGE (*)    All other components within normal limits  URINE MICROSCOPIC-ADD ON - Abnormal; Notable for the following:    Squamous  Epithelial / LPF 6-30 (*)    Bacteria, UA MANY (*)    All other components within normal limits  CBC WITH DIFFERENTIAL/PLATELET - Abnormal; Notable for the following:    Hemoglobin 10.5 (*)    HCT 31.4 (*)    Neutro Abs 8.2 (*)    All other components within normal limits  COMPREHENSIVE METABOLIC PANEL - Abnormal; Notable for the following:    Potassium 3.3 (*)    ALT 12 (*)    All other components within normal limits  PREGNANCY, URINE  GC/CHLAMYDIA PROBE AMP ([place]) NOT AT [facility]    EKG  EKG Interpretation None       Radiology No results found.  Procedures Procedures (including critical care time)  Medications Ordered in ED Medications  metroNIDAZOLE (FLAGYL) tablet 500 mg (500 mg Oral Given 09/13/16 1937)     Initial Impression / Assessment and Plan / ED Course  I have reviewed the triage vital signs and the nursing notes.  Pertinent labs & imaging results that were available during my care of the patient were reviewed by me and considered in my medical decision making (see chart for details).  Clinical Course    Vitals:   09/13/16 1709 09/13/16 1710 09/13/16 1944  BP:  139/99 103/75  Pulse:  99 78  Resp:  16 18  Temp:  97.8 F (36.6 C) 98.3 F (36.8 C)  TempSrc:  Oral Oral  SpO2:  100% 100%  Weight: 49.9 kg    Height: 5\' 5"  (1.651 m)       Jennifer Riddle is 30 y.o. female presenting with Sensation of itching all over, she had an episode of vaginal discharge last week but states that that is typical for her premenstrual cycle. Normal exam is nonsurgical, patient with no signs of jaundice however given her extensive pruritus will obtain LFTs. Pelvic exam with out signs of PID. A few clue cells were seen, will treat. Contaminated urine specimen difficult to interpret however she is not having any symptoms will not treat for UTI, patient does have greater than 80 ketones. She denies dieting, polyuria, polydipsia, unintentional weight  loss.  Evaluation does not show pathology that would require ongoing emergent intervention or inpatient treatment. Pt is hemodynamically stable and mentating appropriately. Discussed findings and plan with patient/guardian, who agrees with care plan. All questions answered. Return precautions discussed and outpatient follow up given.      Final Clinical Impressions(s) / ED Diagnoses   Final diagnoses:  BV (bacterial vaginosis)  Pruritus    New Prescriptions Discharge Medication List as of 09/13/2016  7:27 PM    START taking these medications   Details  metroNIDAZOLE (FLAGYL) 500 MG tablet Take 1 tablet (500 mg total) by mouth 2 (two) times daily. One po bid x 7 days, Starting Wed 09/13/2016,  Print         Angelyna Mccleery, PA-C 09/14/16 0015    Tanna Furry, MD 09/25/16 2116

## 2016-09-13 NOTE — ED Triage Notes (Signed)
pt c/o vaginal discharge and itching x 2 days

## 2016-09-15 LAB — GC/CHLAMYDIA PROBE AMP (~~LOC~~) NOT AT ARMC
CHLAMYDIA, DNA PROBE: NEGATIVE
NEISSERIA GONORRHEA: NEGATIVE

## 2016-11-20 ENCOUNTER — Emergency Department (HOSPITAL_BASED_OUTPATIENT_CLINIC_OR_DEPARTMENT_OTHER)
Admission: EM | Admit: 2016-11-20 | Discharge: 2016-11-20 | Disposition: A | Discharge status: Home / Self Care | Payer: Self-pay | Attending: Emergency Medicine | Admitting: Emergency Medicine

## 2016-11-20 ENCOUNTER — Encounter (HOSPITAL_BASED_OUTPATIENT_CLINIC_OR_DEPARTMENT_OTHER): Payer: Self-pay

## 2016-11-20 ENCOUNTER — Emergency Department (HOSPITAL_BASED_OUTPATIENT_CLINIC_OR_DEPARTMENT_OTHER): Payer: Self-pay

## 2016-11-20 DIAGNOSIS — Y999 Unspecified external cause status: Secondary | ICD-10-CM | POA: Insufficient documentation

## 2016-11-20 DIAGNOSIS — Z87891 Personal history of nicotine dependence: Secondary | ICD-10-CM | POA: Insufficient documentation

## 2016-11-20 DIAGNOSIS — Y939 Activity, unspecified: Secondary | ICD-10-CM | POA: Insufficient documentation

## 2016-11-20 DIAGNOSIS — Y929 Unspecified place or not applicable: Secondary | ICD-10-CM | POA: Insufficient documentation

## 2016-11-20 DIAGNOSIS — M25521 Pain in right elbow: Secondary | ICD-10-CM | POA: Insufficient documentation

## 2016-11-20 MED ORDER — IBUPROFEN 600 MG PO TABS: 600.0000 mg | ORAL_TABLET | Freq: Four times a day (QID) | ORAL | 0 refills | Status: DC | PRN

## 2016-11-20 MED ORDER — IBUPROFEN 200 MG PO TABS
600.0000 mg | ORAL_TABLET | Freq: Once | ORAL | Status: AC
  Administered 2016-11-20: 600 mg via ORAL
  Filled 2016-11-20: qty 1

## 2016-11-20 NOTE — Discharge Instructions (Signed)
Please read and follow all provided instructions.  Your diagnoses today include:  1. Right elbow pain     Tests performed today include: Vital signs. See below for your results today.   Medications prescribed:  Take as prescribed   Home care instructions:  Follow any educational materials contained in this packet.  Follow-up instructions: Please follow-up with your primary care provider for further evaluation of symptoms and treatment   Return instructions:  Please return to the Emergency Department if you do not get better, if you get worse, or new symptoms OR  - Fever (temperature greater than 101.21F)  - Bleeding that does not stop with holding pressure to the area    -Severe pain (please note that you may be more sore the day after your accident)  - Chest Pain  - Difficulty breathing  - Severe nausea or vomiting  - Inability to tolerate food and liquids  - Passing out  - Skin becoming red around your wounds  - Change in mental status (confusion or lethargy)  - New numbness or weakness    Please return if you have any other emergent concerns.  Additional Information:  Your vital signs today were: BP 119/80 (BP Location: Left Arm)    Pulse 87    Temp 97.8 F (36.6 C) (Oral)    Resp 16    Ht 5\' 5"  (1.651 m)    Wt 46.3 kg    LMP 10/30/2016    SpO2 100%    BMI 16.97 kg/m  If your blood pressure (BP) was elevated above 135/85 this visit, please have this repeated by your doctor within one month. ---------------

## 2016-11-20 NOTE — ED Provider Notes (Signed)
[facility] Provider Note   CSN: [id number] Arrival date & time: 11/20/16  1506     History   Chief Complaint Chief Complaint  Patient presents with  . Fall    HPI Jennifer Riddle is a 30 y.o. female.  HPI  30 y.o. female presents to the Emergency Department today complaining of right elbow pain s/p mechanical fall from hover board. Notes impact on elbow. No numbness/tingling. Rates pain minimal. ROM intact. No open wounds. OTC with moderate relief. Pt wants to make sure it's not broken. No other symptoms noted.    Past Medical History:  Diagnosis Date  . Fibroid tumor     There are no active problems to display for this patient.   Past Surgical History:  Procedure Laterality Date  . KNEE SURGERY      OB History    No data available       Home Medications    Prior to Admission medications   Not on File    Family History No family history on file.  Social History Social History  Substance Use Topics  . Smoking status: Former Research scientist (life sciences)  . Smokeless tobacco: Never Used  . Alcohol use No     Allergies   Sulfa antibiotics   Review of Systems Review of Systems  Constitutional: Negative for fever.  Gastrointestinal: Negative for nausea and vomiting.  Musculoskeletal: Positive for arthralgias.  Skin: Negative for wound.  Neurological: Negative for numbness.   Physical Exam Updated Vital Signs BP 119/80 (BP Location: Left Arm)   Pulse 87   Temp 97.8 F (36.6 C) (Oral)   Resp 16   Ht 5\' 5"  (1.651 m)   Wt 46.3 kg   LMP 10/30/2016   SpO2 100%   BMI 16.97 kg/m   Physical Exam  Constitutional: She is oriented to person, place, and time. Vital signs are normal. She appears well-developed and well-nourished.  HENT:  Head: Normocephalic.  Right Ear: Hearing normal.  Left Ear: Hearing normal.  Eyes: Conjunctivae and EOM are normal. Pupils are equal, round, and reactive to light.  Cardiovascular: Normal rate and regular rhythm.     Pulmonary/Chest: Effort normal.  Musculoskeletal:  Right elbow with mild TTP. Mild swelling on olecranon. ROM intact. NVI. Motor intact.   Neurological: She is alert and oriented to person, place, and time.  Skin: Skin is warm and dry.  Psychiatric: She has a normal mood and affect. Her speech is normal and behavior is normal. Thought content normal.  Nursing note and vitals reviewed.  ED Treatments / Results  Labs (all labs ordered are listed, but only abnormal results are displayed) Labs Reviewed - No data to display  EKG  EKG Interpretation None       Radiology Dg Elbow Complete Right  Result Date: 11/20/2016 CLINICAL DATA:  Posterior left elbow pain since a fall today. Initial encounter. EXAM: RIGHT ELBOW - COMPLETE 3+ VIEW COMPARISON:  None. FINDINGS: There is no evidence of fracture, dislocation, or joint effusion. There is no evidence of arthropathy or other focal bone abnormality. Soft tissues are unremarkable. IMPRESSION: Normal study. Electronically Signed   By: Inge Rise M.D.   On: 11/20/2016 15:25    Procedures Procedures (including critical care time)  Medications Ordered in ED Medications - No data to display   Initial Impression / Assessment and Plan / ED Course  I have reviewed the triage vital signs and the nursing notes.  Pertinent labs & imaging results that were available  during my care of the patient were reviewed by me and considered in my medical decision making (see chart for details).  Clinical Course    Final Clinical Impressions(s) / ED Diagnoses   {I have reviewed and evaluated the relevant imaging studies.  {I have reviewed the relevant previous healthcare records.  {I obtained HPI from historian.   ED Course:  Assessment: Patient X-Ray negative for obvious fracture or dislocation.  Pt advised to follow up with PCP. Patient given analgesia while in ED, conservative therapy recommended and discussed. Patient will be discharged home &  is agreeable with above plan. Returns precautions discussed. Pt appears safe for discharge.  Disposition/Plan:  DC Home Additional Verbal discharge instructions given and discussed with patient.  Pt Instructed to f/u with PCP in the next week for evaluation and treatment of symptoms. Return precautions given Pt acknowledges and agrees with plan  Supervising Physician Veryl Speak, MD  Final diagnoses:  Right elbow pain    New Prescriptions New Prescriptions   No medications on file     Shary Decamp, PA-C 11/20/16 1604    Veryl Speak, MD 11/20/16 2021

## 2016-11-20 NOTE — ED Triage Notes (Signed)
Pain to right elbow after falling from hoer board today-NAD-steady gait

## 2017-05-30 ENCOUNTER — Encounter (HOSPITAL_BASED_OUTPATIENT_CLINIC_OR_DEPARTMENT_OTHER): Payer: Self-pay | Admitting: *Deleted

## 2017-05-30 ENCOUNTER — Emergency Department (HOSPITAL_BASED_OUTPATIENT_CLINIC_OR_DEPARTMENT_OTHER)
Admission: EM | Admit: 2017-05-30 | Discharge: 2017-05-31 | Disposition: A | Discharge status: Home / Self Care | Payer: Self-pay | Attending: Emergency Medicine | Admitting: Emergency Medicine

## 2017-05-30 DIAGNOSIS — Z87891 Personal history of nicotine dependence: Secondary | ICD-10-CM | POA: Insufficient documentation

## 2017-05-30 DIAGNOSIS — J029 Acute pharyngitis, unspecified: Secondary | ICD-10-CM | POA: Insufficient documentation

## 2017-05-30 LAB — RAPID STREP SCREEN (MED CTR MEBANE ONLY): STREPTOCOCCUS, GROUP A SCREEN (DIRECT): NEGATIVE

## 2017-05-30 MED ORDER — IBUPROFEN 100 MG/5ML PO SUSP
500.0000 mg | Freq: Once | ORAL | Status: AC
  Administered 2017-05-30: 500 mg via ORAL
  Filled 2017-05-30: qty 25

## 2017-05-30 NOTE — ED Triage Notes (Signed)
Pt c/o sore throat x4days.

## 2017-05-30 NOTE — Discharge Instructions (Signed)
Ibuprofen 600mg  every six hours as needed for pain or fever.  Drink plenty of fluids and get plenty of rest.   Follow up with your primary doctor if not improving in the next 3-4 days, and return to the ER if symptoms significantly worsen or change.

## 2017-05-30 NOTE — ED Provider Notes (Signed)
[facility] Provider Note   CSN: [id number] Arrival date & time: 05/30/17  2309  By signing my name below, I, Dora Sims, attest that this documentation has been prepared under the direction and in the presence of physician practitioner, Veryl Speak, MD. Electronically Signed: Dora Sims, Scribe. 05/30/2017. 11:52 PM.  History   Chief Complaint Chief Complaint  Patient presents with  . Sore Throat   The history is provided by the patient. No language interpreter was used.    HPI Comments: Jennifer Riddle is a 31 y.o. female who presents to the Emergency Department complaining of a constant, gradually worsening sore throat for 4-5 days. There are no associated symptoms or alleviating factors noted. Her sore throat is exacerbated by swallowing but she denies dysphagia. No known ill contacts. No regular medications. She denies fevers, chills, cough, or any other associated symptoms.  Past Medical History:  Diagnosis Date  . Fibroid tumor     There are no active problems to display for this patient.   Past Surgical History:  Procedure Laterality Date  . KNEE SURGERY      OB History    No data available       Home Medications    Prior to Admission medications   Medication Sig Start Date End Date Taking? Authorizing Provider  ibuprofen (ADVIL,MOTRIN) 600 MG tablet Take 1 tablet (600 mg total) by mouth every 6 (six) hours as needed. 11/20/16   Shary Decamp, PA-C    Family History History reviewed. No pertinent family history.  Social History Social History  Substance Use Topics  . Smoking status: Former Research scientist (life sciences)  . Smokeless tobacco: Never Used  . Alcohol use No     Allergies   Sulfa antibiotics   Review of Systems Review of Systems All other systems reviewed and are negative for acute change except as noted in the HPI. Physical Exam Updated Vital Signs BP 125/82   Pulse 88   Temp 98.9 F (37.2 C) (Oral)   Resp 18   Ht 5\' 5"  (1.651 m)    Wt 105 lb (47.6 kg)   LMP 05/09/2017   SpO2 100%   BMI 17.47 kg/m   Physical Exam  Constitutional: She is oriented to person, place, and time. She appears well-developed and well-nourished. No distress.  HENT:  Head: Normocephalic and atraumatic.  Mouth/Throat: Posterior oropharyngeal erythema present. No oropharyngeal exudate or tonsillar abscesses. No tonsillar exudate.  Eyes: EOM are normal.  Neck: Normal range of motion.  Cardiovascular: Normal rate, regular rhythm and normal heart sounds.   Pulmonary/Chest: Effort normal and breath sounds normal.  Abdominal: Soft. She exhibits no distension. There is no tenderness.  Musculoskeletal: Normal range of motion.  Neurological: She is alert and oriented to person, place, and time.  Skin: Skin is warm and dry.  Psychiatric: She has a normal mood and affect. Judgment normal.  Nursing note and vitals reviewed.  ED Treatments / Results  Labs (all labs ordered are listed, but only abnormal results are displayed) Labs Reviewed  RAPID STREP SCREEN (NOT AT [facility])  CULTURE, GROUP A STREP [facility])    EKG  EKG Interpretation None       Radiology No results found.  Procedures Procedures (including critical care time)  DIAGNOSTIC STUDIES: Oxygen Saturation is 100% on RA, normal by my interpretation.    COORDINATION OF CARE: 11:50 PM Discussed treatment plan with pt at bedside and pt agreed to plan.  Medications Ordered in ED Medications  ibuprofen (ADVIL,MOTRIN)  100 MG/5ML suspension 500 mg (500 mg Oral Given 05/30/17 2335)     Initial Impression / Assessment and Plan / ED Course  I have reviewed the triage vital signs and the nursing notes.  Pertinent labs & imaging results that were available during my care of the patient were reviewed by me and considered in my medical decision making (see chart for details).  Strep test negative. Symptoms most likely viral in nature. Will recommend ibuprofen and follow-up as  needed.  Final Clinical Impressions(s) / ED Diagnoses   Final diagnoses:  None    New Prescriptions New Prescriptions   No medications on file   I personally performed the services described in this documentation, which was scribed in my presence. The recorded information has been reviewed and is accurate.       Veryl Speak, MD 05/31/17 [phone]

## 2017-05-31 NOTE — ED Notes (Signed)
Pt verbalizes understanding of d/c instructions and denies any further needs at this time. 

## 2017-06-03 LAB — CULTURE, GROUP A STREP (THRC)

## 2019-04-28 ENCOUNTER — Emergency Department (HOSPITAL_BASED_OUTPATIENT_CLINIC_OR_DEPARTMENT_OTHER): Payer: Self-pay

## 2019-04-28 ENCOUNTER — Encounter (HOSPITAL_BASED_OUTPATIENT_CLINIC_OR_DEPARTMENT_OTHER): Payer: Self-pay

## 2019-04-28 ENCOUNTER — Other Ambulatory Visit: Payer: Self-pay

## 2019-04-28 ENCOUNTER — Emergency Department (HOSPITAL_BASED_OUTPATIENT_CLINIC_OR_DEPARTMENT_OTHER)
Admission: EM | Admit: 2019-04-28 | Discharge: 2019-04-28 | Disposition: A | Discharge status: Home / Self Care | Payer: Self-pay | Attending: Emergency Medicine | Admitting: Emergency Medicine

## 2019-04-28 DIAGNOSIS — S6991XA Unspecified injury of right wrist, hand and finger(s), initial encounter: Secondary | ICD-10-CM

## 2019-04-28 DIAGNOSIS — Y990 Civilian activity done for income or pay: Secondary | ICD-10-CM | POA: Insufficient documentation

## 2019-04-28 DIAGNOSIS — Z87891 Personal history of nicotine dependence: Secondary | ICD-10-CM | POA: Insufficient documentation

## 2019-04-28 DIAGNOSIS — Y939 Activity, unspecified: Secondary | ICD-10-CM | POA: Insufficient documentation

## 2019-04-28 DIAGNOSIS — W231XXA Caught, crushed, jammed, or pinched between stationary objects, initial encounter: Secondary | ICD-10-CM | POA: Insufficient documentation

## 2019-04-28 DIAGNOSIS — S63652A Sprain of metacarpophalangeal joint of right middle finger, initial encounter: Secondary | ICD-10-CM | POA: Insufficient documentation

## 2019-04-28 DIAGNOSIS — Y929 Unspecified place or not applicable: Secondary | ICD-10-CM | POA: Insufficient documentation

## 2019-04-28 NOTE — Discharge Instructions (Addendum)
You have been diagnosed today with injury of the right hand, suspected right middle MCP joint sprain.  At this time there does not appear to be the presence of an emergent medical condition, however there is always the potential for conditions to change. Please read and follow the below instructions.  Please return to the Emergency Department immediately for any new or worsening symptoms. Please be sure to follow up with your Primary Care Provider within one week regarding your visit today; please call their office to schedule an appointment even if you are feeling better for a follow-up visit. Please continue to use rest, ice and elevation to help with your symptoms.  Call the hand specialist Dr. Brennan Bailey office to schedule a follow-up appointment for within the next week.  Your x-ray today did not show any fractures or dislocations however small unseen fractures may still be present.  Additionally sprains and strains are likely and follow-up with the hand specialist is recommended.  Please use your brace provided to you today to continue to protect your hand from further injury.  Return for any new or worsening symptoms.  You may use over-the-counter anti-inflammatory medications such as Tylenol as directed on the packaging to help with your symptoms.  Get help right away if: You suddenly have very bad pain in your hand. You had feeling (sensation) in your hand before but you suddenly lose feeling. Your wrist or hand becomes bent (contracted) without you trying to bend it. Your symptoms had gotten better and they suddenly get worse. Your hand or fingers are turning pink or blue. Get help right away if: Your hand becomes warm, red, or swollen. Your hand is numb or tingling. Your hand is extremely swollen or deformed. Your hand or fingers turn white or blue. You cannot move your hand, wrist, or fingers. Any new/concerning or worsening symptoms.  Please read the additional information packets  attached to your discharge summary.

## 2019-04-28 NOTE — ED Provider Notes (Signed)
[facility] EMERGENC[facility] DEPARTMENT Provider Note   CSN: [id number] Arrival date & time: 04/28/19  1517    History   Chief Complaint Chief Complaint  Patient presents with   Hand Injury    HPI Jennifer Riddle is a 33 y.o. female is in today for injury of the right hand.  Patient reports that she was at work when her hand was struck between a metal crate and another object.  Patient reports that she had an immediate throbbing moderate intensity pain worsened with movement and improved with rest.  Patient noted swelling and presented to the ER, she has been given ice prior to my evaluation and reports that her swelling has improved greatly.  Patient reports that the majority of her pain is along the third metacarpal.  Patient denies any other injury or concern.  HPI  Past Medical History:  Diagnosis Date   Fibroid tumor     There are no active problems to display for this patient.   Past Surgical History:  Procedure Laterality Date   KNEE SURGERY       OB History   No obstetric history on file.      Home Medications    Prior to Admission medications   Medication Sig Start Date End Date Taking? Authorizing Provider  ibuprofen (ADVIL,MOTRIN) 600 MG tablet Take 1 tablet (600 mg total) by mouth every 6 (six) hours as needed. 11/20/16   Shary Decamp, PA-C    Family History History reviewed. No pertinent family history.  Social History Social History   Tobacco Use   Smoking status: Former Smoker   Smokeless tobacco: Never Used  Substance Use Topics   Alcohol use: No   Drug use: No     Allergies   Sulfa antibiotics   Review of Systems Review of Systems  Constitutional: Negative.  Negative for chills and fever.  Gastrointestinal: Negative.  Negative for abdominal pain, nausea and vomiting.  Musculoskeletal: Positive for arthralgias (Right hand) and joint swelling (Right hand).  Skin: Negative.  Negative for color change and wound.    Neurological: Negative.  Negative for weakness, numbness and headaches.   Physical Exam Updated Vital Signs BP 132/85 (BP Location: Left Arm)    Pulse 79    Temp 98.1 F (36.7 C) (Oral)    Resp 20    Ht 5\' 5"  (1.651 m)    Wt 54.4 kg    LMP 04/13/2019    SpO2 100%    BMI 19.97 kg/m   Physical Exam Constitutional:      General: She is not in acute distress.    Appearance: Normal appearance. She is well-developed. She is not ill-appearing or diaphoretic.  HENT:     Head: Normocephalic and atraumatic.     Right Ear: External ear normal.     Left Ear: External ear normal.     Nose: Nose normal.  Eyes:     General: Vision grossly intact. Gaze aligned appropriately.     Pupils: Pupils are equal, round, and reactive to light.  Neck:     Musculoskeletal: Normal range of motion.     Trachea: Trachea and phonation normal. No tracheal deviation.  Cardiovascular:     Rate and Rhythm: Normal rate and regular rhythm.     Pulses:          Radial pulses are 2+ on the right side and 2+ on the left side.  Pulmonary:     Effort: Pulmonary effort is normal. No respiratory  distress.  Abdominal:     General: There is no distension.     Palpations: Abdomen is soft.     Tenderness: There is no abdominal tenderness. There is no guarding or rebound.  Musculoskeletal: Normal range of motion.     Right elbow: Normal.    Right wrist: Normal.     Right forearm: Normal.     Right hand: She exhibits tenderness (Right third MCP/metacarpal) and swelling (Minimal). She exhibits normal range of motion, normal capillary refill and no deformity. Normal sensation noted. Normal strength noted.       Hands:  Skin:    General: Skin is warm and dry.  Neurological:     Mental Status: She is alert.     GCS: GCS eye subscore is 4. GCS verbal subscore is 5. GCS motor subscore is 6.     Comments: Speech is clear and goal oriented, follows commands Major Cranial nerves without deficit, no facial droop Moves  extremities without ataxia, coordination intact  Psychiatric:        Behavior: Behavior normal.    ED Treatments / Results  Labs (all labs ordered are listed, but only abnormal results are displayed) Labs Reviewed - No data to display  EKG None  Radiology Dg Hand Complete Right  Result Date: 04/28/2019 CLINICAL DATA:  Pain after trauma. EXAM: RIGHT HAND - COMPLETE 3+ VIEW COMPARISON:  None. FINDINGS: There is no evidence of fracture or dislocation. There is no evidence of arthropathy or other focal bone abnormality. Soft tissues are unremarkable. IMPRESSION: Negative. Electronically Signed   By: Dorise Bullion III M.D   On: 04/28/2019 15:45    Procedures Procedures (including critical care time)  Medications Ordered in ED Medications - No data to display   Initial Impression / Assessment and Plan / ED Course  I have reviewed the triage vital signs and the nursing notes.  Pertinent labs & imaging results that were available during my care of the patient were reviewed by me and considered in my medical decision making (see chart for details).    33 year old otherwise healthy female presenting today for hand injury that occurred at work just prior to arrival.  Imaging today negative for acute findings.  Patient reports pain along right third MCP and metacarpal without deformity.  Swelling has improved since use of ice here in the emergency department.  Patient neurovascular intact capillary refill and sensation intact all movements intact mild increase of pain of the right MCP with movement.  No injury or break in the skin.  Patient has been provided with finger splint here in the emergency department, referral to hand as needed and informed that despite negative imaging that occult fracture is possible and that sprain/strain is likely.  No signs of cellulitis, septic arthritis, compartment syndrome, DVT or other acute emergent medical conditions.  Suspect strain of right third MCP.  Discussed rice therapy, OTC anti-inflammatories and encouraged to call and for follow-up of this week.  Patient denies any other injury or concerns today.  At this time there does not appear to be any evidence of an acute emergency medical condition and the patient appears stable for discharge with appropriate outpatient follow up. Diagnosis was discussed with patient who verbalizes understanding of care plan and is agreeable to discharge. I have discussed return precautions with patient who verbalizes understanding of return precautions. Patient encouraged to follow-up with their PCP and hand. All questions answered.  Patient's case discussed with Dr. Maryan Rued who agrees with plan  to discharge with follow-up.   Note: Portions of this report may have been transcribed using voice recognition software. Every effort was made to ensure accuracy; however, inadvertent computerized transcription errors may still be present. Final Clinical Impressions(s) / ED Diagnoses   Final diagnoses:  Sprain of metacarpophalangeal (MCP) joint of right middle finger, initial encounter  Injury of right hand, initial encounter    ED Discharge Orders    None       Gari Crown 04/28/19 1722    Blanchie Dessert, MD 04/28/19 2217

## 2019-04-28 NOTE — ED Notes (Signed)
ED Provider at bedside. 

## 2019-04-28 NOTE — ED Triage Notes (Signed)
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 Pt has noticeable swelling in triage.

## 2021-04-04 IMAGING — DX RIGHT HAND - COMPLETE 3+ VIEW
3 series · 3 of 3 positions shown · non-contrast
Comparison: None.

CLINICAL DATA: Pain after trauma.

EXAM:
RIGHT HAND - COMPLETE 3+ VIEW

[hand pa]
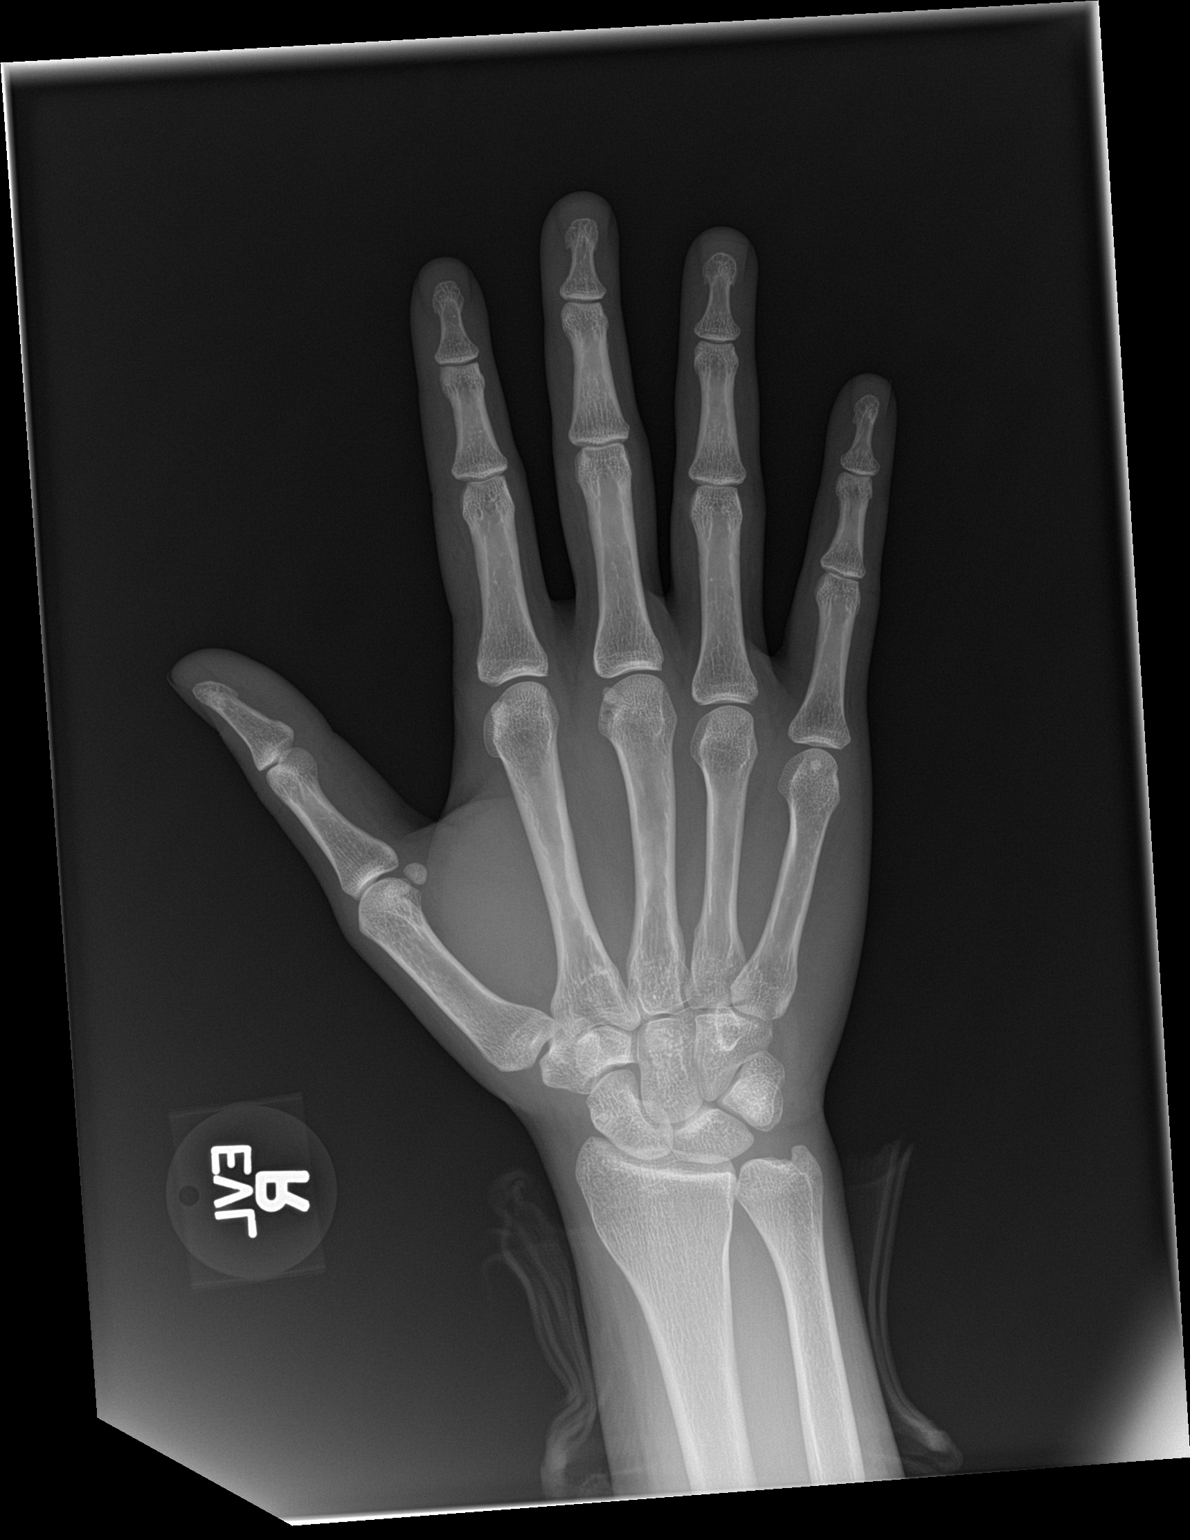

[hand obl]
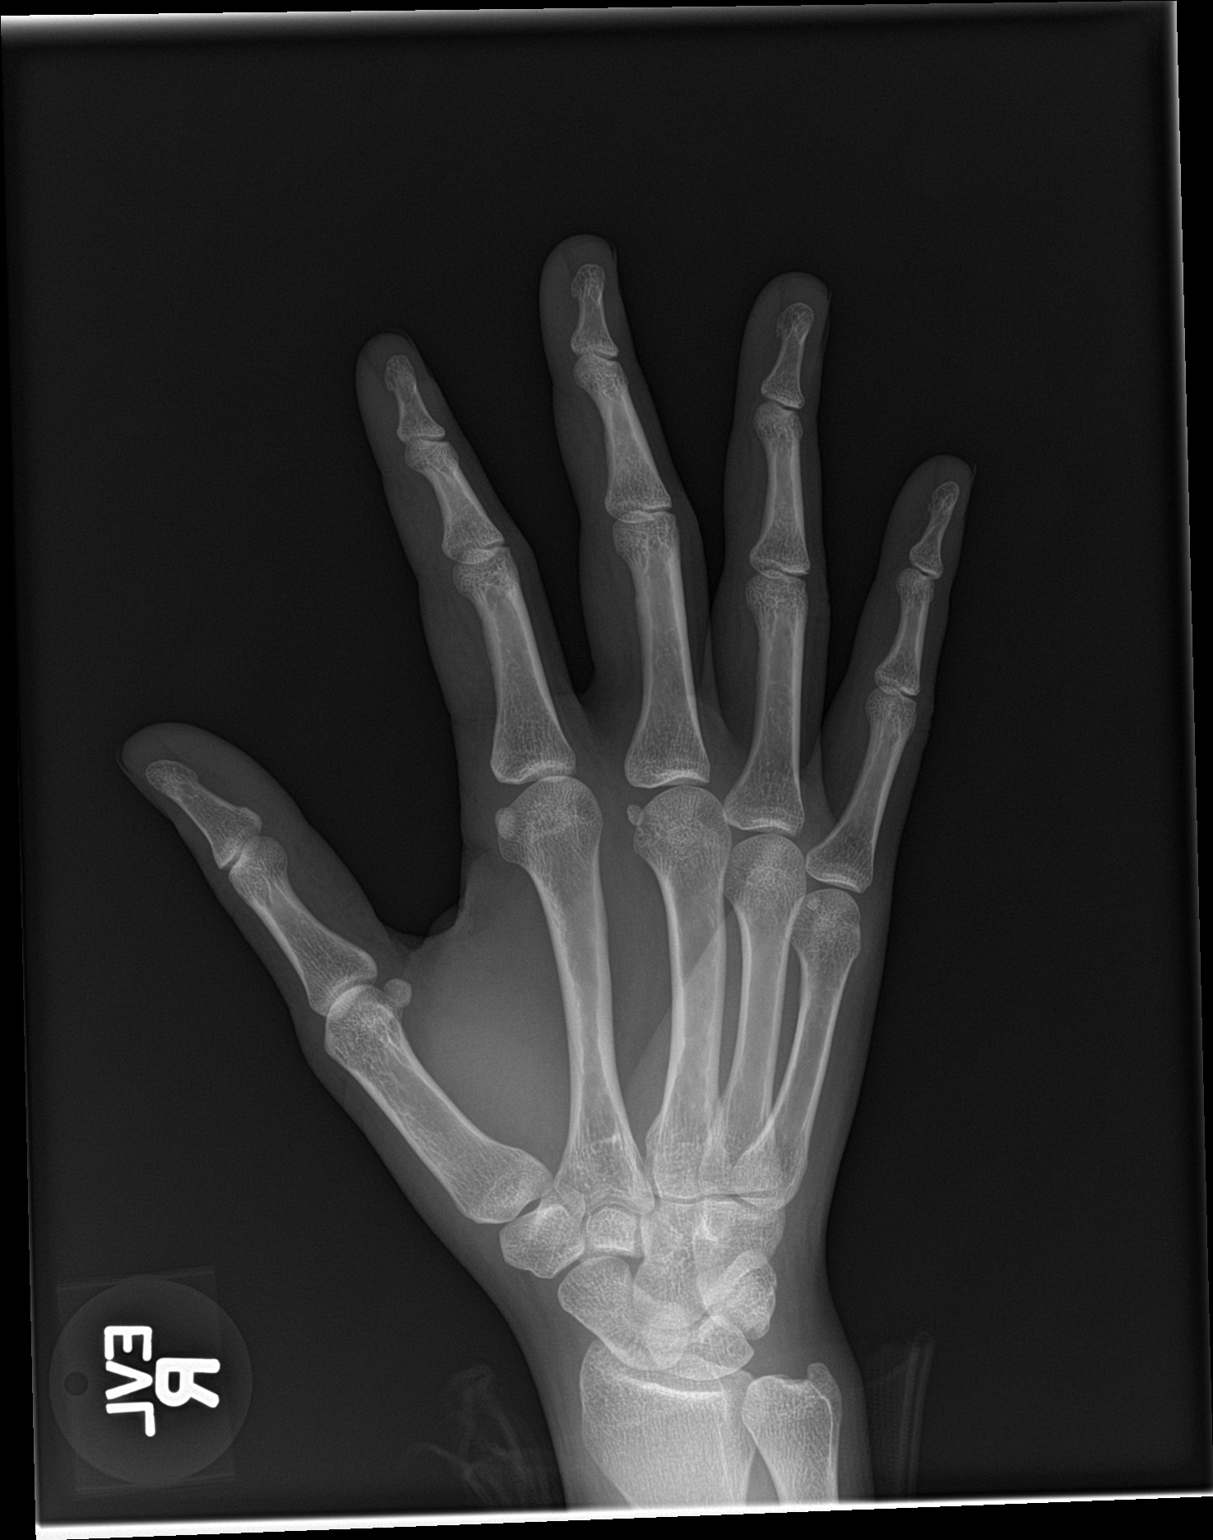

[hand lat]
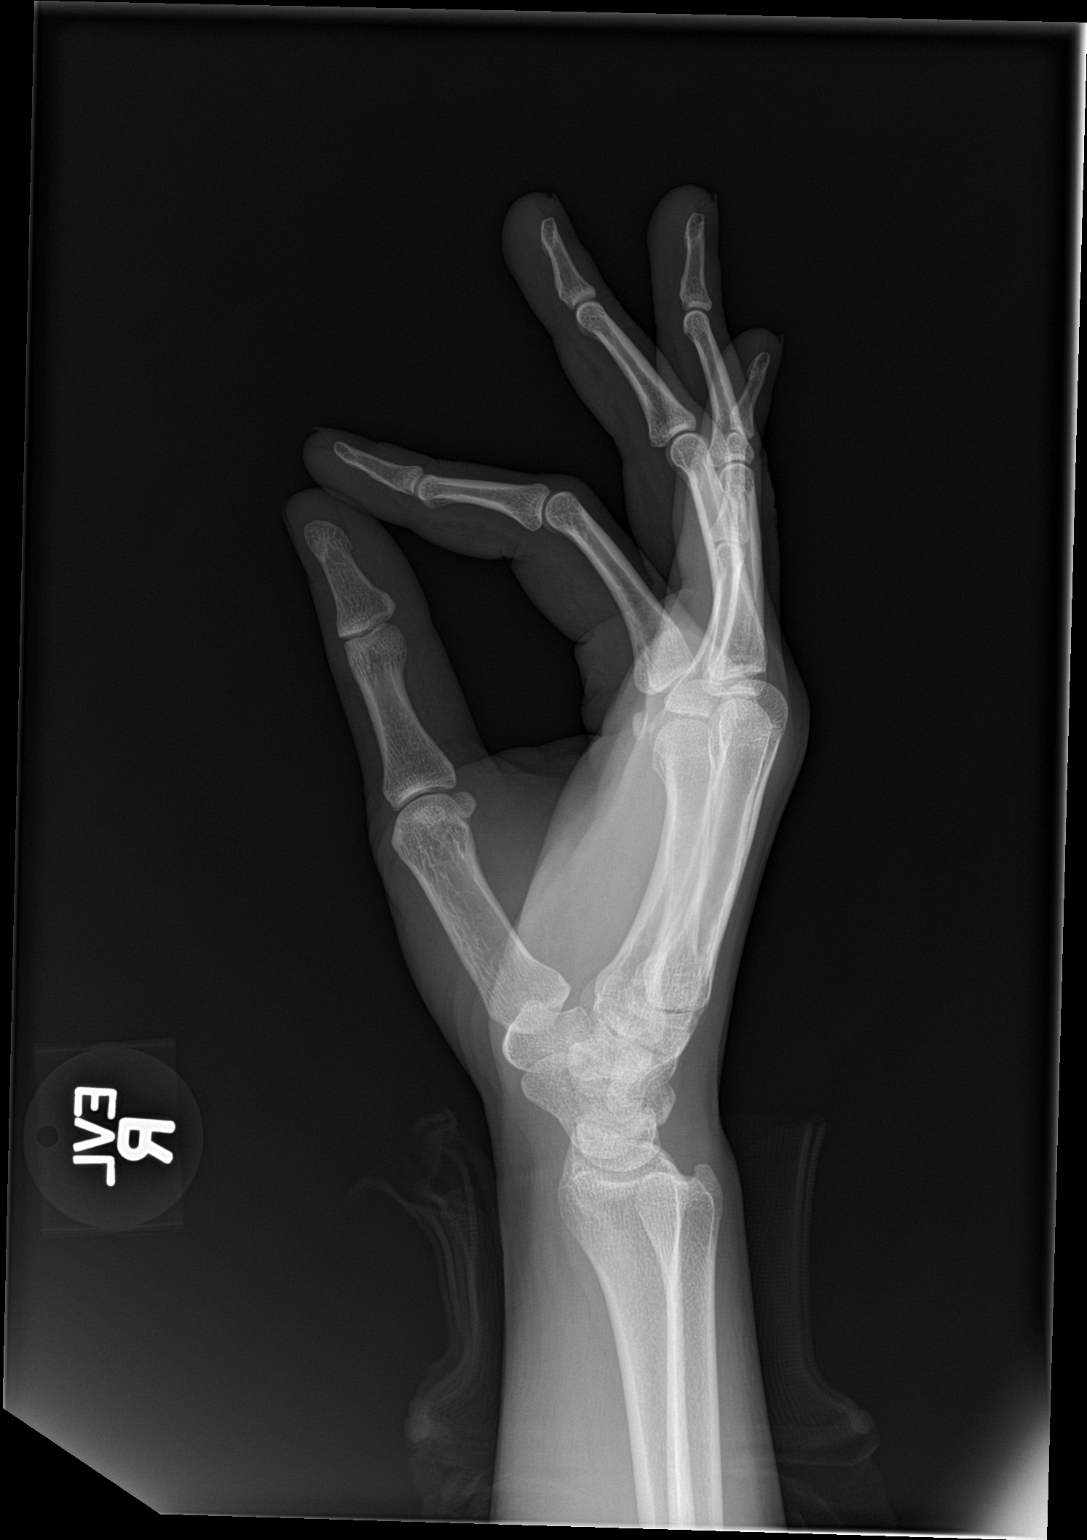

[3 of 3 positions shown; findings below may reference images not displayed]

FINDINGS: There is no evidence of fracture or dislocation. There is no
evidence of arthropathy or other focal bone abnormality. Soft
tissues are unremarkable.
IMPRESSION: Negative.

## 2022-09-29 ENCOUNTER — Emergency Department (HOSPITAL_BASED_OUTPATIENT_CLINIC_OR_DEPARTMENT_OTHER)
Admission: EM | Admit: 2022-09-29 | Discharge: 2022-09-29 | Disposition: A | Discharge status: Home / Self Care | Payer: Self-pay | Attending: Emergency Medicine | Admitting: Emergency Medicine

## 2022-09-29 ENCOUNTER — Encounter (HOSPITAL_BASED_OUTPATIENT_CLINIC_OR_DEPARTMENT_OTHER): Payer: Self-pay

## 2022-09-29 ENCOUNTER — Other Ambulatory Visit: Payer: Self-pay

## 2022-09-29 DIAGNOSIS — Z20822 Contact with and (suspected) exposure to covid-19: Secondary | ICD-10-CM | POA: Insufficient documentation

## 2022-09-29 DIAGNOSIS — J028 Acute pharyngitis due to other specified organisms: Secondary | ICD-10-CM | POA: Insufficient documentation

## 2022-09-29 DIAGNOSIS — J069 Acute upper respiratory infection, unspecified: Secondary | ICD-10-CM | POA: Insufficient documentation

## 2022-09-29 DIAGNOSIS — B9789 Other viral agents as the cause of diseases classified elsewhere: Secondary | ICD-10-CM | POA: Insufficient documentation

## 2022-09-29 LAB — RESP PANEL BY RT-PCR (FLU A&B, COVID) ARPGX2
Influenza A by PCR: NEGATIVE
Influenza B by PCR: NEGATIVE
SARS Coronavirus 2 by RT PCR: NEGATIVE

## 2022-09-29 LAB — GROUP A STREP BY PCR: Group A Strep by PCR: NOT DETECTED

## 2022-09-29 MED ORDER — ACETAMINOPHEN 325 MG PO TABS
650.0000 mg | ORAL_TABLET | Freq: Once | ORAL | Status: AC
  Administered 2022-09-29: 650 mg via ORAL
  Filled 2022-09-29: qty 2

## 2022-09-29 MED ORDER — LIDOCAINE VISCOUS HCL 2 % MT SOLN
15.0000 mL | Freq: Once | OROMUCOSAL | Status: AC
  Administered 2022-09-29: 15 mL via OROMUCOSAL
  Filled 2022-09-29: qty 15

## 2022-09-29 NOTE — ED Triage Notes (Signed)
Pt reports that she is having cold symptoms. Right ear pain, sore throat, runny nose, cough, headache x 2 days. Denies fever. Congestion

## 2022-09-29 NOTE — ED Provider Notes (Signed)
[place] EMERGENCY DEPARTMENT Provider Note   CSN: [id number] Arrival date & time: 09/29/22  0825     History  Chief Complaint  Patient presents with   URI    DEMICA ZOOK is a 36 y.o. female with noncontributory past medical history presents with concern for right ear pain, sore throat, runny nose, cough, headache for 2 days.  Patient reports that her daughter is sick who is around high school age.  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  Patient denies any objective fever at home.  She denies any chest pain, shortness of breath.  She denies any nausea, vomiting.  She reports that she has had a history of getting strep throat even in adulthood.  Patient did not get her COVID or flu shot this year.   URI Presenting symptoms: sore throat        Home Medications Prior to Admission medications   Not on File      Allergies    Sulfa antibiotics    Review of Systems   Review of Systems  HENT:  Positive for sore throat.   All other systems reviewed and are negative.   Physical Exam Updated Vital Signs BP 126/83   Pulse 83   Temp 98.4 F (36.9 C)   Resp 18   Ht '5\' 5"'$  (1.651 m)   Wt 59 kg   SpO2 100%   BMI 21.63 kg/m  Physical Exam Vitals and nursing note reviewed.  Constitutional:      General: She is not in acute distress.    Appearance: Normal appearance.  HENT:     Head: Normocephalic and atraumatic.     Right Ear: Tympanic membrane normal.     Left Ear: Tympanic membrane normal.     Mouth/Throat:     Comments: minimal posterior oropharynx erythema, without swelling but with minimal white exudate. Uvula midline, tonsils 1+ bilaterally.  No trismus, stridor, evidence of PTA, floor of mouth swelling or redness.   Eyes:     General:        Right eye: No discharge.        Left eye: No discharge.  Cardiovascular:     Rate and Rhythm: Normal rate and regular rhythm.  Pulmonary:     Effort:  Pulmonary effort is normal. No respiratory distress.  Musculoskeletal:        General: No deformity.  Skin:    General: Skin is warm and dry.  Neurological:     Mental Status: She is alert and oriented to person, place, and time.  Psychiatric:        Mood and Affect: Mood normal.        Behavior: Behavior normal.     ED Results / Procedures / Treatments   Labs (all labs ordered are listed, but only abnormal results are displayed) Labs Reviewed  RESP PANEL BY RT-PCR (FLU A&B, COVID) ARPGX2  GROUP A STREP BY PCR    EKG None  Radiology No results found.  Procedures Procedures    Medications Ordered in ED Medications  acetaminophen (TYLENOL) tablet 650 mg (650 mg Oral Given 09/29/22 0915)  lidocaine (XYLOCAINE) 2 % viscous mouth solution 15 mL (15 mLs Mouth/Throat Given 09/29/22 0915)    ED Course/ Medical Decision Making/ A&P                           Medical  Decision Making Risk OTC drugs. Prescription drug management.   This is an overall well-appearing 36 year old female who presents with sore throat, upper respiratory congestion, ear pain.  My emergent differential diagnosis includes upper respiratory infection, acute bronchitis, pneumonia, COVID, flu, acute otitis media or otitis externa, other more significant ENT emergency, tonsillitis, peritonsillar abscess, versus other.  This is not an exhaustive differential.  On my exam patient is overall well-appearing, she has some minimal posterior oropharynx erythema, with some questionable exudate, I independently interpreted RVP which is negative for COVID, flu, PCR which is negative for strep.  In context of these negative tests, discussed with patient that her symptoms are most consistent with a viral upper respiratory infection, viral pharyngitis, recommend over-the-counter cough and cold medication, lozenges, tea with honey, rest, ibuprofen as needed for body aches, Tylenol as needed for fever.  Patient is discharged  in stable condition at this time, return precautions are given. Final Clinical Impression(s) / ED Diagnoses Final diagnoses:  Viral upper respiratory tract infection  Sore throat (viral)    Rx / DC Orders ED Discharge Orders     None         Anselmo Pickler, PA-C 09/29/22 1015    Kemper Durie, DO 09/29/22 1058

## 2024-01-13 ENCOUNTER — Emergency Department (HOSPITAL_BASED_OUTPATIENT_CLINIC_OR_DEPARTMENT_OTHER)
Admission: EM | Admit: 2024-01-13 | Discharge: 2024-01-13 | Disposition: A | Discharge status: Home / Self Care | Payer: Self-pay | Attending: Emergency Medicine | Admitting: Emergency Medicine

## 2024-01-13 ENCOUNTER — Other Ambulatory Visit: Payer: Self-pay

## 2024-01-13 DIAGNOSIS — J069 Acute upper respiratory infection, unspecified: Secondary | ICD-10-CM | POA: Insufficient documentation

## 2024-01-13 LAB — RESP PANEL BY RT-PCR (RSV, FLU A&B, COVID)  RVPGX2
Influenza A by PCR: NEGATIVE
Influenza B by PCR: NEGATIVE
Resp Syncytial Virus by PCR: NEGATIVE
SARS Coronavirus 2 by RT PCR: NEGATIVE

## 2024-01-13 LAB — GROUP A STREP BY PCR: Group A Strep by PCR: DETECTED — AB

## 2024-01-13 MED ORDER — DEXAMETHASONE 4 MG PO TABS
10.0000 mg | ORAL_TABLET | Freq: Once | ORAL | Status: AC
  Administered 2024-01-13: 10 mg via ORAL
  Filled 2024-01-13: qty 3

## 2024-01-13 MED ORDER — KETOROLAC TROMETHAMINE 15 MG/ML IJ SOLN
15.0000 mg | Freq: Once | INTRAMUSCULAR | Status: AC
  Administered 2024-01-13: 15 mg via INTRAMUSCULAR
  Filled 2024-01-13: qty 1

## 2024-01-13 MED ORDER — AMOXICILLIN 500 MG PO CAPS: 1000.0000 mg | ORAL_CAPSULE | Freq: Every day | ORAL | 0 refills | Status: AC

## 2024-01-13 NOTE — ED Provider Notes (Addendum)
[facility] Provider Note   CSN: [id number] Arrival date & time: 01/13/24  1617     History Chief Complaint  Patient presents with   flu like symptoms    HPI Jennifer Riddle is a 38 y.o. female presenting for fever cough congestion, sore throat x 1 day.   Patient's recorded medical, surgical, social, medication list and allergies were reviewed in the Snapshot window as part of the initial history.   Review of Systems   Review of Systems  Constitutional:  Positive for fever. Negative for chills.  HENT:  Positive for congestion. Negative for ear pain and sore throat.   Eyes:  Negative for pain and visual disturbance.  Respiratory:  Positive for cough. Negative for shortness of breath.   Cardiovascular:  Negative for chest pain and palpitations.  Gastrointestinal:  Negative for abdominal pain and vomiting.  Genitourinary:  Negative for dysuria and hematuria.  Musculoskeletal:  Negative for arthralgias and back pain.  Skin:  Negative for color change and rash.  Neurological:  Negative for seizures and syncope.  All other systems reviewed and are negative.   Physical Exam Updated Vital Signs BP (!) 132/90 (BP Location: Right Arm)   Pulse (!) 106   Temp 98.8 F (37.1 C)   Resp 18   Ht 5\' 5"  (1.651 m)   Wt 59 kg   LMP 01/04/2024   SpO2 100%   BMI 21.63 kg/m  Physical Exam Vitals and nursing note reviewed.  Constitutional:      General: She is not in acute distress.    Appearance: She is well-developed.  HENT:     Head: Normocephalic and atraumatic.  Eyes:     Conjunctiva/sclera: Conjunctivae normal.  Cardiovascular:     Rate and Rhythm: Normal rate and regular rhythm.     Heart sounds: No murmur heard. Pulmonary:     Effort: Pulmonary effort is normal. No respiratory distress.     Breath sounds: Normal breath sounds.  Abdominal:     General: There is no distension.     Palpations: Abdomen is soft.      Tenderness: There is no abdominal tenderness. There is no right CVA tenderness or left CVA tenderness.  Musculoskeletal:        General: No swelling or tenderness. Normal range of motion.     Cervical back: Neck supple.  Skin:    General: Skin is warm and dry.  Neurological:     General: No focal deficit present.     Mental Status: She is alert and oriented to person, place, and time. Mental status is at baseline.     Cranial Nerves: No cranial nerve deficit.      ED Course/ Medical Decision Making/ A&P Clinical Course as of 01/13/24 2022  Wynelle Link Jan 13, 2024  2022 Group A Strep by PCR(!): DETECTED [CC]    Clinical Course User Index [CC] Glyn Ade, MD    Procedures Procedures   Medications Ordered in ED Medications  ketorolac (TORADOL) 15 MG/ML injection 15 mg (has no administration in time range)  dexamethasone (DECADRON) tablet 10 mg (has no administration in time range)   Medical Decision Making:   Jennifer Riddle is a 38 y.o. female who presented to the ED today with subjective fever, cough, congestion detailed above.    Complete initial physical exam performed, notably the patient  was hemodynamically stable in no acute distress.  Posterior oropharynx illuminated and without obvious swelling or deformity.  Patient is without neck stiffness.    Reviewed and confirmed nursing documentation for past medical history, family history, social history.    Initial Assessment:   With the patient's presentation of fever cough congestion, most likely diagnosis is developing viral upper respiratory infection. Other diagnoses were considered including (but not limited to) peritonsillar abscess, retropharyngeal abscess, pneumonia. These are considered less likely due to history of present illness and physical exam findings.   This is most consistent with an acute life/limb threatening illness complicated by underlying chronic conditions. Considered meningitis, however patient's  symptoms, vital signs, physical exam findings including lack of meningismus seem grossly less consistent at this time. Initial Plan:  Viral screening including COVID/flu testing to evaluate for common viral etiologies that need to be tracked Empiric treatment with antipyretics including acetaminophen in ambulatory setting As patient has sore throat, CENTOR Score dictates the following evaluation: Strep Screen Additionally will treat sore throat with dexamethasone dose Objective evaluation as below reviewed   Initial Study Results:   Laboratory  All laboratory results reviewed without evidence of clinically relevant pathology.    Radiology:  All images reviewed independently. Agree with radiology report at this time.   No orders to display    Final Assessment and Plan:   On reassessment, patient is ambulatory tolerating p.o. intake in no acute distress.   Will follow-up strep screen asynchronously.  COVID flu negative likely viral upper respiratory infection.  Patient is currently stable for outpatient care and management with no indication for hospitalization or transfer at this time.  Discussed all findings with patient expressed understanding.  Disposition:  Based on the above findings, I believe patient is stable for discharge.    Patient/family educated about specific return precautions for given chief complaint and symptoms.  Patient/family educated about follow-up with PCP.     Patient/family expressed understanding of return precautions and need for follow-up. Patient spoken to regarding all imaging and laboratory results and appropriate follow up for these results. All education provided in verbal form with additional information in written form. Time was allowed for answering of patient questions. Patient discharged.    Emergency Department Medication Summary:   Medications  ketorolac (TORADOL) 15 MG/ML injection 15 mg (has no administration in time range)  dexamethasone  (DECADRON) tablet 10 mg (has no administration in time range)     Clinical Impression:  1. Upper respiratory tract infection, unspecified type      Discharge   Final Clinical Impression(s) / ED Diagnoses Final diagnoses:  Upper respiratory tract infection, unspecified type    Rx / DC Orders ED Discharge Orders     None         Glyn Ade, MD 01/13/24 1907  Called with positive strep test.  2X patient identifiers.  Ordered Amoxil for outpatient setting.    Glyn Ade, MD 01/13/24 2023

## 2024-01-13 NOTE — ED Triage Notes (Signed)
Pt reports have she has a headache, sore throat, congestion. Neck is also sore. States that her symptoms began at 0130am.

## 2024-01-13 NOTE — Discharge Instructions (Addendum)
You were seen today for a group of symptoms and were diagnosed with an upper respiratory infection. Upper respiratory infections are caused by a multitude of viruses.  There are thousands that can potentially infect the human body and we only have test for a few of them.  Antibiotics do not work against these viruses.  My recommendation for treatment is as follows: Furthermore: Viruses causes a myriad of symptoms including fever, cough, congestion, malaise and muscle aches. My recommended treatment protocol is as follows: I recommend using combination cold and flu medication.  Think of brands like Vicks DayQuil and NyQuil or the store brand generic of the same medication.  These will typically include acetaminophen for fever, doxylamine for cough and sleep, and similar supportive medications all-in-one simple product.  Follow the label on the bottle. If your muscle aches are breaking through and still causing severe distress despite using the cold and flu products above, you can add on anti-inflammatories from the nonsteroidal anti-inflammatory drug class such as Motrin, Aleve, Advil.  You need to follow the label on the bottle and consider if you have ever been told to avoid these medications in the past especially if you are on anticoagulation or have a history of kidney injury or stomach ulcers.
